# Patient Record
Sex: Male | Born: 1955 | Race: Black or African American | Hispanic: No | Smoking: Current every day smoker
Health system: Southern US, Community
[De-identification: ages and names within clinical notes are randomized; demographics above are authoritative.]

## PROBLEM LIST (undated history)

## (undated) DIAGNOSIS — J939 Pneumothorax, unspecified: Secondary | ICD-10-CM

## (undated) DIAGNOSIS — R569 Unspecified convulsions: Secondary | ICD-10-CM

## (undated) DIAGNOSIS — F419 Anxiety disorder, unspecified: Secondary | ICD-10-CM

## (undated) DIAGNOSIS — M549 Dorsalgia, unspecified: Secondary | ICD-10-CM

---

## 2011-11-15 ENCOUNTER — Emergency Department (HOSPITAL_COMMUNITY)
Admission: EM | Admit: 2011-11-15 | Discharge: 2011-11-15 | Disposition: A | Discharge status: Home / Self Care | Payer: Self-pay | Attending: Emergency Medicine | Admitting: Emergency Medicine

## 2011-11-15 ENCOUNTER — Emergency Department (HOSPITAL_COMMUNITY): Payer: Self-pay

## 2011-11-15 ENCOUNTER — Encounter (HOSPITAL_COMMUNITY): Payer: Self-pay

## 2011-11-15 DIAGNOSIS — S2239XA Fracture of one rib, unspecified side, initial encounter for closed fracture: Secondary | ICD-10-CM

## 2011-11-15 DIAGNOSIS — R079 Chest pain, unspecified: Secondary | ICD-10-CM | POA: Insufficient documentation

## 2011-11-15 DIAGNOSIS — R6884 Jaw pain: Secondary | ICD-10-CM | POA: Insufficient documentation

## 2011-11-15 DIAGNOSIS — S2249XA Multiple fractures of ribs, unspecified side, initial encounter for closed fracture: Secondary | ICD-10-CM | POA: Insufficient documentation

## 2011-11-15 DIAGNOSIS — F17200 Nicotine dependence, unspecified, uncomplicated: Secondary | ICD-10-CM | POA: Insufficient documentation

## 2011-11-15 DIAGNOSIS — K029 Dental caries, unspecified: Secondary | ICD-10-CM | POA: Insufficient documentation

## 2011-11-15 DIAGNOSIS — R220 Localized swelling, mass and lump, head: Secondary | ICD-10-CM | POA: Insufficient documentation

## 2011-11-15 DIAGNOSIS — K0889 Other specified disorders of teeth and supporting structures: Secondary | ICD-10-CM | POA: Insufficient documentation

## 2011-11-15 DIAGNOSIS — R221 Localized swelling, mass and lump, neck: Secondary | ICD-10-CM | POA: Insufficient documentation

## 2011-11-15 HISTORY — DX: Pneumothorax, unspecified: J93.9

## 2011-11-15 MED ORDER — OXYCODONE-ACETAMINOPHEN 5-325 MG PO TABS: 1.0000 | ORAL_TABLET | ORAL | Status: AC | PRN

## 2011-11-15 MED ORDER — IBUPROFEN 800 MG PO TABS
800.0000 mg | ORAL_TABLET | Freq: Once | ORAL | Status: AC
  Administered 2011-11-15: 800 mg via ORAL
  Filled 2011-11-15: qty 1

## 2011-11-15 MED ORDER — METHOCARBAMOL 500 MG PO TABS: 500.0000 mg | ORAL_TABLET | Freq: Two times a day (BID) | ORAL | Status: AC

## 2011-11-15 MED ORDER — IBUPROFEN 800 MG PO TABS: 600.0000 mg | ORAL_TABLET | Freq: Three times a day (TID) | ORAL | Status: AC

## 2011-11-15 MED ORDER — OXYCODONE-ACETAMINOPHEN 5-325 MG PO TABS
1.0000 | ORAL_TABLET | Freq: Once | ORAL | Status: AC
  Administered 2011-11-15: 1 via ORAL
  Filled 2011-11-15: qty 1

## 2011-11-15 NOTE — ED Notes (Signed)
[employer] at bedside 
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
## 2011-11-15 NOTE — ED Notes (Signed)
Pt given instructions on insentive spirometry and returned excellent demo to nurse on use. Pt able to do ~2100 w/ 7 inspirations

## 2011-11-22 NOTE — ED Provider Notes (Signed)
Medical screening examination/treatment/procedure(s) were conducted as a shared visit with non-physician practitioner(s) and myself.  I personally evaluated the patient during the encounter  Derwood Kaplan, MD 11/22/11 0023

## 2014-12-26 ENCOUNTER — Emergency Department (HOSPITAL_COMMUNITY): Payer: No Typology Code available for payment source

## 2014-12-26 ENCOUNTER — Encounter (HOSPITAL_COMMUNITY): Payer: Self-pay | Admitting: Emergency Medicine

## 2014-12-26 ENCOUNTER — Emergency Department (HOSPITAL_COMMUNITY)
Admission: EM | Admit: 2014-12-26 | Discharge: 2014-12-26 | Disposition: A | Discharge status: Home / Self Care | Payer: No Typology Code available for payment source | Attending: Emergency Medicine | Admitting: Emergency Medicine

## 2014-12-26 DIAGNOSIS — R0602 Shortness of breath: Secondary | ICD-10-CM | POA: Diagnosis not present

## 2014-12-26 DIAGNOSIS — Z8709 Personal history of other diseases of the respiratory system: Secondary | ICD-10-CM | POA: Diagnosis not present

## 2014-12-26 DIAGNOSIS — Y998 Other external cause status: Secondary | ICD-10-CM | POA: Diagnosis not present

## 2014-12-26 DIAGNOSIS — S2232XA Fracture of one rib, left side, initial encounter for closed fracture: Secondary | ICD-10-CM | POA: Diagnosis not present

## 2014-12-26 DIAGNOSIS — Z8659 Personal history of other mental and behavioral disorders: Secondary | ICD-10-CM | POA: Diagnosis not present

## 2014-12-26 DIAGNOSIS — R0789 Other chest pain: Secondary | ICD-10-CM

## 2014-12-26 DIAGNOSIS — Y9289 Other specified places as the place of occurrence of the external cause: Secondary | ICD-10-CM | POA: Insufficient documentation

## 2014-12-26 DIAGNOSIS — Z720 Tobacco use: Secondary | ICD-10-CM | POA: Insufficient documentation

## 2014-12-26 DIAGNOSIS — S299XXA Unspecified injury of thorax, initial encounter: Secondary | ICD-10-CM | POA: Diagnosis present

## 2014-12-26 DIAGNOSIS — Y9389 Activity, other specified: Secondary | ICD-10-CM | POA: Insufficient documentation

## 2014-12-26 HISTORY — DX: Anxiety disorder, unspecified: F41.9

## 2014-12-26 MED ORDER — ACETAMINOPHEN 500 MG PO TABS
1000.0000 mg | ORAL_TABLET | Freq: Once | ORAL | Status: AC
  Administered 2014-12-26: 1000 mg via ORAL
  Filled 2014-12-26: qty 2
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
## 2014-12-26 NOTE — ED Notes (Signed)
Patient transported to X-ray 
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
## 2014-12-26 NOTE — ED Notes (Signed)
Awake. Verbally responsive. A/O x4. Resp even and unlabored. No audible adventitious breath sounds noted. ABC's intact.  

## 2014-12-26 NOTE — Discharge Instructions (Signed)
Take 4 over the counter ibuprofen tablets 3 times a day or 2 over-the-counter naproxen tablets twice a day for pain. ° °Rib Fracture °A rib fracture is a break or crack in one of the bones of the ribs. The ribs are a group of long, curved bones that wrap around your chest and attach to your spine. They protect your lungs and other organs in the chest cavity. A broken or cracked rib is often painful, but most do not cause other problems. Most rib fractures heal on their own over time. However, rib fractures can be more serious if multiple ribs are broken or if broken ribs move out of place and push against other structures. °CAUSES  °· A direct blow to the chest. For example, this could happen during contact sports, a car accident, or a fall against a hard object. °· Repetitive movements with high force, such as pitching a baseball or having severe coughing spells. °SYMPTOMS  °· Pain when you breathe in or cough. °· Pain when someone presses on the injured area. °DIAGNOSIS  °Your caregiver will perform a physical exam. Various imaging tests may be ordered to confirm the diagnosis and to look for related injuries. These tests may include a chest X-ray, computed tomography (CT), magnetic resonance imaging (MRI), or a bone scan. °TREATMENT  °Rib fractures usually heal on their own in 1-3 months. The longer healing period is often associated with a continued cough or other aggravating activities. During the healing period, pain control is very important. Medication is usually given to control pain. Hospitalization or surgery may be needed for more severe injuries, such as those in which multiple ribs are broken or the ribs have moved out of place.  °HOME CARE INSTRUCTIONS  °· Avoid strenuous activity and any activities or movements that cause pain. Be careful during activities and avoid bumping the injured rib. °· Gradually increase activity as directed by your caregiver. °· Only take over-the-counter or prescription  medications as directed by your caregiver. Do not take other medications without asking your caregiver first. °· Apply ice to the injured area for the first 1-2 days after you have been treated or as directed by your caregiver. Applying ice helps to reduce inflammation and pain. °¨ Put ice in a plastic bag. °¨ Place a towel between your skin and the bag.   °¨ Leave the ice on for 15-20 minutes at a time, every 2 hours while you are awake. °· Perform deep breathing as directed by your caregiver. This will help prevent pneumonia, which is a common complication of a broken rib. Your caregiver may instruct you to: °¨ Take deep breaths several times a day. °¨ Try to cough several times a day, holding a pillow against the injured area. °¨ Use a device called an incentive spirometer to practice deep breathing several times a day. °· Drink enough fluids to keep your urine clear or pale yellow. This will help you avoid constipation.   °· Do not wear a rib belt or binder. These restrict breathing, which can lead to pneumonia.   °SEEK IMMEDIATE MEDICAL CARE IF:  °· You have a fever.   °· You have difficulty breathing or shortness of breath.   °· You develop a continual cough, or you cough up thick or bloody sputum. °· You feel sick to your stomach (nausea), throw up (vomit), or have abdominal pain.   °· You have worsening pain not controlled with medications.   °MAKE SURE YOU: °· Understand these instructions. °· Will watch your condition. °·   Will get help right away if you are not doing well or get worse. Document Released: 04/22/2005 Document Revised: 12/23/2012 Document Reviewed: 06/24/2012 [facility] Patient Information 2015 [place], [place]. This information is not intended to replace advice given to you by your health care provider. Make sure you discuss any questions you have with your health care provider.

## 2015-04-10 ENCOUNTER — Emergency Department (HOSPITAL_COMMUNITY): Payer: No Typology Code available for payment source

## 2015-04-10 ENCOUNTER — Emergency Department (HOSPITAL_COMMUNITY)
Admission: EM | Admit: 2015-04-10 | Discharge: 2015-04-10 | Disposition: A | Discharge status: Home / Self Care | Payer: No Typology Code available for payment source | Attending: Emergency Medicine | Admitting: Emergency Medicine

## 2015-04-10 ENCOUNTER — Encounter (HOSPITAL_COMMUNITY): Payer: Self-pay | Admitting: Family Medicine

## 2015-04-10 DIAGNOSIS — R1013 Epigastric pain: Secondary | ICD-10-CM | POA: Diagnosis not present

## 2015-04-10 DIAGNOSIS — R1110 Vomiting, unspecified: Secondary | ICD-10-CM | POA: Insufficient documentation

## 2015-04-10 DIAGNOSIS — F17200 Nicotine dependence, unspecified, uncomplicated: Secondary | ICD-10-CM | POA: Insufficient documentation

## 2015-04-10 DIAGNOSIS — Z8709 Personal history of other diseases of the respiratory system: Secondary | ICD-10-CM | POA: Diagnosis not present

## 2015-04-10 DIAGNOSIS — Z8659 Personal history of other mental and behavioral disorders: Secondary | ICD-10-CM | POA: Diagnosis not present

## 2015-04-10 LAB — URINALYSIS, ROUTINE W REFLEX MICROSCOPIC
GLUCOSE, UA: NEGATIVE mg/dL
HGB URINE DIPSTICK: NEGATIVE
Ketones, ur: NEGATIVE mg/dL
Leukocytes, UA: NEGATIVE
Nitrite: NEGATIVE
Protein, ur: NEGATIVE mg/dL
SPECIFIC GRAVITY, URINE: 1.017 (ref 1.005–1.030)
pH: 6 (ref 5.0–8.0)

## 2015-04-10 LAB — COMPREHENSIVE METABOLIC PANEL
ALBUMIN: 3.7 g/dL (ref 3.5–5.0)
ALT: 124 U/L — AB (ref 17–63)
ANION GAP: 10 (ref 5–15)
AST: 77 U/L — ABNORMAL HIGH (ref 15–41)
Alkaline Phosphatase: 131 U/L — ABNORMAL HIGH (ref 38–126)
BUN: 10 mg/dL (ref 6–20)
CHLORIDE: 91 mmol/L — AB (ref 101–111)
CO2: 30 mmol/L (ref 22–32)
Calcium: 9.5 mg/dL (ref 8.9–10.3)
Creatinine, Ser: 1.1 mg/dL (ref 0.61–1.24)
GFR calc non Af Amer: >60 mL/min (ref >60)
Glucose, Bld: 115 mg/dL — ABNORMAL HIGH (ref 65–99)
Potassium: 3.2 mmol/L — ABNORMAL LOW (ref 3.5–5.1)
SODIUM: 131 mmol/L — AB (ref 135–145)
Total Bilirubin: 0.7 mg/dL (ref 0.3–1.2)
Total Protein: 7.5 g/dL (ref 6.5–8.1)

## 2015-04-10 LAB — CBC
HEMATOCRIT: 38.2 % — AB (ref 39.0–52.0)
HEMOGLOBIN: 13.3 g/dL (ref 13.0–17.0)
MCH: 33.4 pg (ref 26.0–34.0)
MCHC: 34.8 g/dL (ref 30.0–36.0)
MCV: 96 fL (ref 78.0–100.0)
Platelets: 269 10*3/uL (ref 150–400)
RBC: 3.98 MIL/uL — AB (ref 4.22–5.81)
RDW: 12.6 % (ref 11.5–15.5)
WBC: 6.7 10*3/uL (ref 4.0–10.5)

## 2015-04-10 LAB — LIPASE, BLOOD: Lipase: 60 U/L — ABNORMAL HIGH (ref 11–51)

## 2015-04-10 MED ORDER — SUCRALFATE 1 GM/10ML PO SUSP: 1.0000 g | Freq: Three times a day (TID) | ORAL | Status: DC

## 2015-04-10 MED ORDER — OMEPRAZOLE 20 MG PO CPDR: 20.0000 mg | DELAYED_RELEASE_CAPSULE | Freq: Every day | ORAL | Status: DC

## 2015-04-10 MED ORDER — POTASSIUM CHLORIDE 10 MEQ/100ML IV SOLN
10.0000 meq | Freq: Once | INTRAVENOUS | Status: AC
  Administered 2015-04-10: 10 meq via INTRAVENOUS
  Filled 2015-04-10: qty 100

## 2015-04-10 MED ORDER — POTASSIUM CHLORIDE CRYS ER 20 MEQ PO TBCR
40.0000 meq | EXTENDED_RELEASE_TABLET | Freq: Once | ORAL | Status: AC
  Administered 2015-04-10: 40 meq via ORAL
  Filled 2015-04-10: qty 2

## 2015-04-10 NOTE — Discharge Instructions (Signed)

## 2015-04-10 NOTE — ED Notes (Signed)
Pt here for sickness x 1 week. sts N,V and the sensation of something stuck in esophagus. sts vomited dark emesis. sts unable to eat anything solid. sts knot in upper abdomen.

## 2015-04-10 NOTE — ED Provider Notes (Signed)
CSN: [id number]     Arrival date & time 04/10/15  1438 History   First MD Initiated Contact with Patient 04/10/15 1841     Chief Complaint  Patient presents with  . Abdominal Pain  . Emesis   HPI  Nathaniel Murphy is a 59 -year-old male with a past medical history of anxiety and recurrent pneumothoraces presenting with abdominal pain and vomiting. He reports that approximately one week ago he began experiencing severe epigastric pain every time he attempted to eat or drink. He also states that he has had multiple episodes of vomiting "molasses like stuff" when the epigastric pain first began. He has not vomited in the past few days. He states that his abdominal pain is only present after eating. He states that if he abstains from food than the pain resolves. He states that he is able to drink some soups and water without significant pain but cannot tolerate large, solid meals. He denies having the epigastric pain in the past or acid reflux symptoms. He does not take any PPIs. He states he has taken Mylanta and Pepto-Bismol in an attempt to relieve his abdominal pain without success. Denies fevers, chills, headaches, dizziness, syncope, chest pain, SOB, cough, nausea, diarrhea or rash.  Past Medical History  Diagnosis Date  . Pneumothorax   . Anxiety    History reviewed. No pertinent past surgical history. Family History  Problem Relation Age of Onset  . Heart failure Mother   . Cancer Father   . Heart failure Brother    Social History  Substance Use Topics  . Smoking status: Current Every Day Smoker -- 0.50 packs/day  . Smokeless tobacco: None  . Alcohol Use: 3.6 oz/week    6 Cans of beer per week    Review of Systems  Constitutional: Negative for fever and chills.  HENT: Negative.   Eyes: Negative for visual disturbance.  Respiratory: Negative for shortness of breath.   Cardiovascular: Negative for chest pain.  Gastrointestinal: Positive for vomiting and abdominal pain. Negative for  nausea, diarrhea and blood in stool.  Neurological: Negative for dizziness, syncope and headaches.  All other systems reviewed and are negative.     Allergies  Review of patient's allergies indicates no known allergies.  Home Medications   Prior to Admission medications   Medication Sig Start Date End Date Taking? Authorizing Provider  alum & mag hydroxide-simeth (MAALOX/MYLANTA) 200-200-20 MG/5ML suspension Take 30 mLs by mouth every 6 (six) hours as needed for indigestion or heartburn.   Yes Historical Provider, MD  bismuth subsalicylate (PEPTO BISMOL) 262 MG/15ML suspension Take 30 mLs by mouth every 6 (six) hours as needed for indigestion.   Yes Historical Provider, MD  ibuprofen (ADVIL,MOTRIN) 200 MG tablet Take 400 mg by mouth every 4 (four) hours as needed (pain).   Yes Historical Provider, MD  Mouthwashes (ANTISEPTIC MOUTH RINSE MT) Use as directed 5 mLs in the mouth or throat as needed (pain/irritation).   Yes Historical Provider, MD  omeprazole (PRILOSEC) 20 MG capsule Take 1 capsule (20 mg total) by mouth daily. 04/10/15   Nathaniel Conley, PA-C  sucralfate (CARAFATE) 1 GM/10ML suspension Take 10 mLs (1 g total) by mouth 4 (four) times daily -  with meals and at bedtime. 04/10/15   Nathaniel Manes, PA-C   BP 128/81 mmHg  Pulse 67  Temp(Src) 98.7 F (37.1 C)  Resp 18  SpO2 100% Physical Exam  Constitutional: He appears well-developed and well-nourished. No distress.  HENT:  Head: Normocephalic and  atraumatic.  Mouth/Throat: Oropharynx is clear and moist.  Eyes: Conjunctivae are normal. Right eye exhibits no discharge. Left eye exhibits no discharge. No scleral icterus.  Neck: Normal range of motion.  Cardiovascular: Normal rate, regular rhythm and normal heart sounds.   Pulmonary/Chest: Effort normal and breath sounds normal. No respiratory distress. He has no wheezes. He has no rales.  Abdominal: Soft. Normal appearance. There is tenderness in the epigastric area. There is no  rigidity, no rebound and no guarding.  Musculoskeletal: Normal range of motion. He exhibits no edema or tenderness.  Neurological: He is alert. Coordination normal.  Skin: Skin is warm and dry.  Psychiatric: He has a normal mood and affect. His behavior is normal.  Nursing note and vitals reviewed.   ED Course  Procedures (including critical care time) Labs Review Labs Reviewed  LIPASE, BLOOD - Abnormal; Notable for the following:    Lipase 60 (*)    All other components within normal limits  COMPREHENSIVE METABOLIC PANEL - Abnormal; Notable for the following:    Sodium 131 (*)    Potassium 3.2 (*)    Chloride 91 (*)    Glucose, Bld 115 (*)    AST 77 (*)    ALT 124 (*)    Alkaline Phosphatase 131 (*)    All other components within normal limits  CBC - Abnormal; Notable for the following:    RBC 3.98 (*)    HCT 38.2 (*)    All other components within normal limits  URINALYSIS, ROUTINE W REFLEX MICROSCOPIC (NOT AT [facility]) - Abnormal; Notable for the following:    Bilirubin Urine SMALL (*)    All other components within normal limits    Imaging Review US Abdomen Complete  04/10/2015  CLINICAL DATA:  Acute onset of epigastric abdominal pain. Elevated LFTs and lipase. Initial encounter. EXAM: ULTRASOUND ABDOMEN COMPLETE COMPARISON:  None. FINDINGS: Gallbladder: No gallstones or wall thickening visualized. Contracted and grossly unremarkable in appearance. No sonographic Murphy sign noted. Common bile duct: Diameter: 0.3 cm, within normal limits in caliber. Liver: No focal lesion identified. Diffusely increased parenchymal echogenicity, likely reflecting fatty infiltration. IVC: No abnormality visualized. Pancreas: Visualized portion unremarkable. Spleen: Size and appearance within normal limits. Right Kidney: Length: 9.4 cm. Echogenicity within normal limits. No mass or hydronephrosis visualized. Left Kidney: Length: 5.4 cm. Echogenicity within normal limits. No mass or hydronephrosis  visualized. Abdominal aorta: No aneurysm visualized. Other findings: None. IMPRESSION: 1. No acute abnormality seen within the abdomen. 2. Mild fatty infiltration within the liver. Electronically Signed   By: Garald Balding M.D.   On: 04/10/2015 22:12   I have personally reviewed and evaluated these images and lab results as part of my medical decision-making.   EKG Interpretation None      MDM   Final diagnoses:  Epigastric abdominal pain   59 year old male presenting with post-prandial epigastric pain x 1 week. Also noted "molasses like" vomiting a few days ago but this has resolved. VSS. Patient is nontoxic, nonseptic appearing, in no apparent distress. Labs, imaging and vitals reviewed. Potassium 3.2 and repleted in ED. LFTs and alk phos slightly elevated. Abdominal US negative for acute processes. PUD likely source of symptoms. Patient does not meet the SIRS or Sepsis criteria.  On repeat exam patient remains asymptomatic and does not have a surgical abdomen and there are no peritoneal signs. He is requesting to eat. Patient discharged home with symptomatic treatment and given strict instructions for follow-up with their primary  care physician or gastroenterology.  I have also discussed reasons to return immediately to the ER. Patient expresses understanding and agrees with plan.      Josephina Gip, PA-C 04/10/15 [place], MD 04/11/15 0000

## 2015-04-10 NOTE — ED Notes (Signed)
Pt states that he hasn't been able to eat due to pain in his throat and stomach. Pt states that "when I eat it feels like I am eating glass or nails:" Pt states that he has tried different home remedies and OTC medications "pepto bismal, mylanta" without relief.

## 2015-06-03 ENCOUNTER — Emergency Department (HOSPITAL_COMMUNITY): Payer: BLUE CROSS/BLUE SHIELD

## 2015-06-03 ENCOUNTER — Encounter (HOSPITAL_COMMUNITY): Payer: Self-pay | Admitting: Emergency Medicine

## 2015-06-03 ENCOUNTER — Emergency Department (HOSPITAL_COMMUNITY)
Admission: EM | Admit: 2015-06-03 | Discharge: 2015-06-03 | Disposition: A | Discharge status: Home / Self Care | Payer: BLUE CROSS/BLUE SHIELD | Attending: Emergency Medicine | Admitting: Emergency Medicine

## 2015-06-03 DIAGNOSIS — F1010 Alcohol abuse, uncomplicated: Secondary | ICD-10-CM

## 2015-06-03 DIAGNOSIS — R55 Syncope and collapse: Secondary | ICD-10-CM

## 2015-06-03 DIAGNOSIS — F10120 Alcohol abuse with intoxication, uncomplicated: Secondary | ICD-10-CM | POA: Diagnosis present

## 2015-06-03 DIAGNOSIS — R51 Headache: Secondary | ICD-10-CM | POA: Insufficient documentation

## 2015-06-03 DIAGNOSIS — Z8709 Personal history of other diseases of the respiratory system: Secondary | ICD-10-CM | POA: Diagnosis not present

## 2015-06-03 DIAGNOSIS — Z79899 Other long term (current) drug therapy: Secondary | ICD-10-CM | POA: Insufficient documentation

## 2015-06-03 DIAGNOSIS — Z8659 Personal history of other mental and behavioral disorders: Secondary | ICD-10-CM | POA: Insufficient documentation

## 2015-06-03 DIAGNOSIS — F17210 Nicotine dependence, cigarettes, uncomplicated: Secondary | ICD-10-CM | POA: Insufficient documentation

## 2015-06-03 LAB — CBC
HEMATOCRIT: 39.9 % (ref 39.0–52.0)
HEMOGLOBIN: 13.6 g/dL (ref 13.0–17.0)
MCH: 32.2 pg (ref 26.0–34.0)
MCHC: 34.1 g/dL (ref 30.0–36.0)
MCV: 94.3 fL (ref 78.0–100.0)
PLATELETS: 105 10*3/uL — AB (ref 150–400)
RBC: 4.23 MIL/uL (ref 4.22–5.81)
RDW: 12.8 % (ref 11.5–15.5)
WBC: 3.7 10*3/uL — AB (ref 4.0–10.5)

## 2015-06-03 LAB — URINALYSIS, ROUTINE W REFLEX MICROSCOPIC
Bilirubin Urine: NEGATIVE
GLUCOSE, UA: NEGATIVE mg/dL
Hgb urine dipstick: NEGATIVE
Ketones, ur: NEGATIVE mg/dL
LEUKOCYTES UA: NEGATIVE
Nitrite: NEGATIVE
PROTEIN: 30 mg/dL — AB
Specific Gravity, Urine: 1.01 (ref 1.005–1.030)
pH: 6.5 (ref 5.0–8.0)

## 2015-06-03 LAB — BASIC METABOLIC PANEL
ANION GAP: 13 (ref 5–15)
BUN: 6 mg/dL (ref 6–20)
CHLORIDE: 101 mmol/L (ref 101–111)
CO2: 27 mmol/L (ref 22–32)
CREATININE: 1.07 mg/dL (ref 0.61–1.24)
Calcium: 8.8 mg/dL — ABNORMAL LOW (ref 8.9–10.3)
GFR calc non Af Amer: >60 mL/min (ref >60)
Glucose, Bld: 98 mg/dL (ref 65–99)
POTASSIUM: 4 mmol/L (ref 3.5–5.1)
SODIUM: 141 mmol/L (ref 135–145)

## 2015-06-03 LAB — URINE MICROSCOPIC-ADD ON

## 2015-06-03 LAB — CBG MONITORING, ED: Glucose-Capillary: 71 mg/dL (ref 65–99)

## 2015-06-03 NOTE — ED Notes (Signed)
I entered the pt's room in order to obtain a CBG. I informed the pt of this, and he initially refused. Pt then gave permission, and asked if it would hurt. I told pt that it would hurt for "less than half of a second." Pt said to his visitors, "If she pokes me, I'll poke her back." Pt's visitor stated, "Half a second? You can't even last that long." I advised pt and visitors that such comments were inappropriate in the ED and they would not be tolerated. The pt then rolled his eyes and stated, "You're getting all huffy now." I then asked the pt if I could test his blood sugar, and he consented. I asked him if I could see his finger, and he held up his middle finger. Pt's nurse Steward Drone, charge nurse Efraim Kaufmann, and charge tech Thayer Ohm were notified, and I will not be rounding on the pt without another staff member to accompany me.

## 2015-06-03 NOTE — Discharge Instructions (Signed)
Please read and follow all provided instructions.  Your diagnoses today include:  1. Syncope, unspecified syncope type   2. Alcohol abuse     Tests performed today include:  Blood counts and electrolytes - low counts due to alcohol use  CT head - no bleeding in brain or other concerning findings  Vital signs. See below for your results today.   Medications prescribed:   None  Take any prescribed medications only as directed.  Home care instructions:  Follow any educational materials contained in this packet.  Avoid tylenol due to your alcohol use.   Please work on decreasing your alcohol use.   Please hydrate well with water or juice over the next several days.   Follow-up instructions: Please follow-up with your primary care provider in the next 3 days for further evaluation of your symptoms.   Return instructions:   Please return to the Emergency Department if you experience worsening symptoms.   Please return if you have any other emergent concerns.  Additional Information:  Your vital signs today were: BP 133/86 mmHg   Pulse 73   Temp(Src) 98 F (36.7 C) (Oral)   Resp 20   Ht  (1.803 m)   Wt 70.308 kg   BMI 21.63 kg/m2   SpO2 96% If your blood pressure (BP) was elevated above 135/85 this visit, please have this repeated by your doctor within one month. --------------

## 2015-06-03 NOTE — ED Provider Notes (Signed)
CSN: [id number]     Arrival date & time 06/03/15  1208 History   First MD Initiated Contact with Patient 06/03/15 1226     Chief Complaint  Patient presents with  . Near Syncope  . Alcohol Intoxication     (Consider location/radiation/quality/duration/timing/severity/associated sxs/prior Treatment) HPI Comments: Patient with history of alcoholism, daily alcohol use presents with complaint of 2 syncopal episodes today. First episode occurred while patient was standing putting his lunch in a microwave. Patient states that he fell to the floor without any warning. He was then helped into a chair. When he got up he passed out again. He admits to drinking vodka last night. He denies chest pain or shortness of breath. EMS was called who noted patient was diaphoretic. Patient states he has never passed out before. He does admit to not eating or drinking well. He has not been sleeping well. Patient states that he has had a persistent frontal headache for the past 4 days. This is unusual for him. He denies any head injuries prior to the headache. Patient denies signs of stroke including: facial droop, slurred speech, aphasia, weakness/numbness in extremities, imbalance/trouble walking. Onset of symptoms acute. Course is resolved. Nothing makes symptoms better or worse.  Patient is a 60 y.o. male presenting with near-syncope and intoxication. The history is provided by the patient.  Near Syncope Associated symptoms include headaches. Pertinent negatives include no abdominal pain, chest pain, coughing, fever, myalgias, nausea, rash, sore throat, vomiting or weakness.  Alcohol Intoxication Associated symptoms include headaches. Pertinent negatives include no abdominal pain, chest pain, coughing, fever, myalgias, nausea, rash, sore throat, vomiting or weakness.    Past Medical History  Diagnosis Date  . Pneumothorax   . Anxiety    History reviewed. No pertinent past surgical history. Family History   Problem Relation Age of Onset  . Heart failure Mother   . Cancer Father   . Heart failure Brother    Social History  Substance Use Topics  . Smoking status: Current Every Day Smoker -- 0.50 packs/day    Types: Cigarettes  . Smokeless tobacco: Never Used  . Alcohol Use: 3.6 oz/week    6 Cans of beer per week     Comment: a fifth a day    Review of Systems  Constitutional: Negative for fever.  HENT: Negative for rhinorrhea and sore throat.   Eyes: Negative for redness.  Respiratory: Negative for cough.   Cardiovascular: Positive for near-syncope. Negative for chest pain.  Gastrointestinal: Negative for nausea, vomiting, abdominal pain and diarrhea.  Genitourinary: Negative for dysuria.  Musculoskeletal: Negative for myalgias.  Skin: Negative for rash.  Neurological: Positive for syncope and headaches. Negative for tremors, seizures, facial asymmetry, speech difficulty and weakness.    Allergies  Influenza vaccines  Home Medications   Prior to Admission medications   Medication Sig Start Date End Date Taking? Authorizing Provider  alum & mag hydroxide-simeth (MAALOX/MYLANTA) 200-200-20 MG/5ML suspension Take 30 mLs by mouth every 6 (six) hours as needed for indigestion or heartburn.   Yes Historical Provider, MD  bismuth subsalicylate (PEPTO BISMOL) 262 MG/15ML suspension Take 30 mLs by mouth every 6 (six) hours as needed for indigestion.   Yes Historical Provider, MD  ibuprofen (ADVIL,MOTRIN) 200 MG tablet Take 400 mg by mouth every 4 (four) hours as needed (pain).   Yes Historical Provider, MD  Mouthwashes (ANTISEPTIC MOUTH RINSE MT) Use as directed 5 mLs in the mouth or throat as needed (pain/irritation).   Yes Historical Provider,  MD  Multiple Vitamins-Minerals (CENTRUM ADULTS PO) Take 1 tablet by mouth daily.   Yes Historical Provider, MD  omeprazole (PRILOSEC) 20 MG capsule Take 1 capsule (20 mg total) by mouth daily. Patient not taking: Reported on 06/03/2015 04/10/15    Rolm Gala Barrett, PA-C  sucralfate (CARAFATE) 1 GM/10ML suspension Take 10 mLs (1 g total) by mouth 4 (four) times daily -  with meals and at bedtime. Patient not taking: Reported on 06/03/2015 04/10/15   Stevi Barrett, PA-C   BP 111/86 mmHg  Pulse 82  Temp(Src) 98 F (36.7 C) (Oral)  Resp 17  Ht  (1.803 m)  Wt 70.308 kg  BMI 21.63 kg/m2  SpO2 96%   Physical Exam  Constitutional: He is oriented to person, place, and time. He appears well-developed and well-nourished.  HENT:  Head: Normocephalic and atraumatic.  Right Ear: Tympanic membrane, external ear and ear canal normal.  Left Ear: Tympanic membrane, external ear and ear canal normal.  Nose: Nose normal.  Mouth/Throat: Uvula is midline, oropharynx is clear and moist and mucous membranes are normal.  Eyes: Conjunctivae, EOM and lids are normal. Pupils are equal, round, and reactive to light. Right eye exhibits no discharge. Left eye exhibits no discharge.  Neck: Normal range of motion. Neck supple.  Cardiovascular: Normal rate, regular rhythm and normal heart sounds.   Pulmonary/Chest: Effort normal and breath sounds normal. No respiratory distress. He has no wheezes. He has no rales.  Abdominal: Soft. There is no tenderness. There is no rebound and no guarding.  Musculoskeletal: Normal range of motion.       Cervical back: He exhibits normal range of motion, no tenderness and no bony tenderness.  Neurological: He is alert and oriented to person, place, and time. He has normal strength and normal reflexes. No cranial nerve deficit or sensory deficit. He exhibits normal muscle tone. He displays a negative Romberg sign. Coordination and gait normal. GCS eye subscore is 4. GCS verbal subscore is 5. GCS motor subscore is 6.  Skin: Skin is warm and dry.  Psychiatric: He has a normal mood and affect.  Nursing note and vitals reviewed.   ED Course  Procedures (including critical care time) Labs Review Labs Reviewed  BASIC  METABOLIC PANEL - Abnormal; Notable for the following:    Calcium 8.8 (*)    All other components within normal limits  CBC - Abnormal; Notable for the following:    WBC 3.7 (*)    Platelets 105 (*)    All other components within normal limits  URINALYSIS, ROUTINE W REFLEX MICROSCOPIC (NOT AT [facility]) - Abnormal; Notable for the following:    Protein, ur 30 (*)    All other components within normal limits  URINE MICROSCOPIC-ADD ON - Abnormal; Notable for the following:    Squamous Epithelial / LPF 0-5 (*)    Bacteria, UA RARE (*)    All other components within normal limits  CBG MONITORING, ED    Imaging Review Ct Head Wo Contrast  06/03/2015  CLINICAL DATA:  60 year old male with headache for 4 days. EXAM: CT HEAD WITHOUT CONTRAST TECHNIQUE: Contiguous axial images were obtained from the base of the skull through the vertex without intravenous contrast. COMPARISON:  11/15/2011 facial CT. FINDINGS: Focal encephalomalacia within the posterior left temporal encephalomalacia is new since 2013. Mild chronic small-vessel white matter ischemic changes are present. No acute intracranial abnormalities are identified, including mass lesion or mass effect, hydrocephalus, extra-axial fluid collection, midline shift, hemorrhage, or acute  infarction. The visualized bony calvarium is unremarkable. IMPRESSION: No evidence of acute intracranial abnormality. Posterior left temporal encephalomalacia. Mild chronic small-vessel white matter ischemic changes. Electronically Signed   By: Harmon Pier M.D.   On: 06/03/2015 14:30   I have personally reviewed and evaluated these images and lab results as part of my medical decision-making.   EKG Interpretation   Date/Time:  Saturday June 03 2015 12:17:50 EST Ventricular Rate:  76 PR Interval:  143 QRS Duration: 101 QT Interval:  428 QTC Calculation: 481 R Axis:   73 Text Interpretation:  Sinus rhythm Left ventricular hypertrophy Borderline  prolonged QT  interval ok. no change from old Confirmed by Donnald Garre, MD,  Lebron Conners [phone]) on 06/03/2015 12:31:37 PM       1:19 PM Patient seen and examined. Patient getting fluids. Will get head CT given EtOH use and new HA. Will check orthostatics and labs. EKG reviewed.   Vital signs reviewed and are as follows: BP 111/86 mmHg  Pulse 82  Temp(Src) 98 F (36.7 C) (Oral)  Resp 17  Ht  (1.803 m)  Wt 70.308 kg  BMI 21.63 kg/m2  SpO2 96%  3:49 PM Patient history and results discussed with Dr. Donnald Garre.   Patient updated on results. He is slightly agitated and wants to leave. No objective symptoms of EtOH withdrawal. He states he is hungry and wants to go home. He continues to have a headache. He is upset that we cannot find the cause of his headache today.  I have encouraged him to hydrate well, follow-up with PCP. Referrals given.   Encouraged return to the emergency department with continued episodes of passing out.  MDM   Final diagnoses:  Syncope, unspecified syncope type  Alcohol abuse   Syncope: EKG with borderline but not pronounced prolonged QT, otherwise normal. Electrolytes normal. No indication today of seizure-like activity. Syncope likely related in part to dehydration from excessive alcohol use. No A. fib. Patient is mildly orthostatic here. Patient otherwise denies chest pain. Low suspicion for arrhythmogenic syncope.   HA: Head CT is negative for bleeding or other problems. No fever or other clinical signs to suggest meningitis. Normal neurological exam and mental status.  Alcohol abuse: Mild leukopenia and thrombocytopenia likely related to alcohol use.  No dangerous or life-threatening conditions suspected or identified by history, physical exam, and by work-up. No indications for hospitalization identified.      Renne Crigler, PA-C 06/03/15 1606  Arby Barrette, MD 06/11/15 [phone]

## 2015-06-03 NOTE — ED Notes (Signed)
Loraine Leriche Roommate - [phone]

## 2015-06-03 NOTE — ED Notes (Signed)
Pt from home via [facility] with c/o 2 near syncopal/increased weakness episodes while walking down the hall.  On EMS arrival pt was diaphoretic and had faint/thready pulses.  EKG unremarkable, NSR.  Pt reports drinking a fifth of vodka between last night and this morning.  NAD, A&O.

## 2016-11-21 ENCOUNTER — Emergency Department (HOSPITAL_COMMUNITY)
Admission: EM | Admit: 2016-11-21 | Discharge: 2016-11-21 | Disposition: A | Discharge status: Home / Self Care | Payer: BLUE CROSS/BLUE SHIELD | Attending: Emergency Medicine | Admitting: Emergency Medicine

## 2016-11-21 ENCOUNTER — Encounter (HOSPITAL_COMMUNITY): Payer: Self-pay | Admitting: Nurse Practitioner

## 2016-11-21 DIAGNOSIS — T2020XA Burn of second degree of head, face, and neck, unspecified site, initial encounter: Secondary | ICD-10-CM

## 2016-11-21 DIAGNOSIS — Y929 Unspecified place or not applicable: Secondary | ICD-10-CM | POA: Insufficient documentation

## 2016-11-21 DIAGNOSIS — T2020XD Burn of second degree of head, face, and neck, unspecified site, subsequent encounter: Secondary | ICD-10-CM | POA: Insufficient documentation

## 2016-11-21 DIAGNOSIS — Y9389 Activity, other specified: Secondary | ICD-10-CM | POA: Insufficient documentation

## 2016-11-21 DIAGNOSIS — Y999 Unspecified external cause status: Secondary | ICD-10-CM | POA: Insufficient documentation

## 2016-11-21 DIAGNOSIS — Z79899 Other long term (current) drug therapy: Secondary | ICD-10-CM | POA: Insufficient documentation

## 2016-11-21 DIAGNOSIS — Z791 Long term (current) use of non-steroidal anti-inflammatories (NSAID): Secondary | ICD-10-CM | POA: Insufficient documentation

## 2016-11-21 DIAGNOSIS — X008XXA Other exposure to uncontrolled fire in building or structure, initial encounter: Secondary | ICD-10-CM | POA: Insufficient documentation

## 2016-11-21 DIAGNOSIS — F17210 Nicotine dependence, cigarettes, uncomplicated: Secondary | ICD-10-CM | POA: Insufficient documentation

## 2016-11-21 HISTORY — DX: Dorsalgia, unspecified: M54.9

## 2016-11-21 MED ORDER — TRAMADOL HCL 50 MG PO TABS: 50.0000 mg | ORAL_TABLET | Freq: Four times a day (QID) | ORAL | 0 refills | Status: DC | PRN
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
## 2016-11-21 NOTE — ED Notes (Signed)
Pt verbalized understanding discharge instructions and denies any further needs or questions at this time. VS stable, ambulatory and steady gait.   

## 2016-11-21 NOTE — Discharge Instructions (Signed)
Please read attached information. If you experience any new or worsening signs or symptoms please return to the emergency room for evaluation. Please follow-up with your primary care provider or specialist as discussed. Please use medication prescribed only as directed and discontinue taking if you have any concerning signs or symptoms.  Please do not drink or drive or operate any machinery while taking medication.
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
## 2017-02-23 ENCOUNTER — Encounter (HOSPITAL_COMMUNITY): Payer: Self-pay | Admitting: Emergency Medicine

## 2017-02-23 ENCOUNTER — Emergency Department (HOSPITAL_COMMUNITY): Payer: Medicaid Other

## 2017-02-23 ENCOUNTER — Inpatient Hospital Stay (HOSPITAL_COMMUNITY)
Admission: EM | Admit: 2017-02-23 | Discharge: 2017-03-26 | DRG: 025 | Disposition: A | Discharge status: Skilled Nursing Facility | Payer: Medicaid Other | Attending: General Surgery | Admitting: General Surgery

## 2017-02-23 ENCOUNTER — Encounter (HOSPITAL_COMMUNITY): Admission: EM | Disposition: A | Discharge status: Skilled Nursing Facility | Payer: Self-pay | Source: Home / Self Care

## 2017-02-23 ENCOUNTER — Emergency Department (HOSPITAL_COMMUNITY): Payer: Medicaid Other | Admitting: Anesthesiology

## 2017-02-23 DIAGNOSIS — E876 Hypokalemia: Secondary | ICD-10-CM

## 2017-02-23 DIAGNOSIS — D649 Anemia, unspecified: Secondary | ICD-10-CM

## 2017-02-23 DIAGNOSIS — R402364 Coma scale, best motor response, obeys commands, 24 hours or more after hospital admission: Secondary | ICD-10-CM | POA: Diagnosis present

## 2017-02-23 DIAGNOSIS — S065XAA Traumatic subdural hemorrhage with loss of consciousness status unknown, initial encounter: Secondary | ICD-10-CM

## 2017-02-23 DIAGNOSIS — J9601 Acute respiratory failure with hypoxia: Secondary | ICD-10-CM

## 2017-02-23 DIAGNOSIS — W108XXA Fall (on) (from) other stairs and steps, initial encounter: Secondary | ICD-10-CM

## 2017-02-23 DIAGNOSIS — S020XXA Fracture of vault of skull, initial encounter for closed fracture: Secondary | ICD-10-CM | POA: Diagnosis present

## 2017-02-23 DIAGNOSIS — R739 Hyperglycemia, unspecified: Secondary | ICD-10-CM | POA: Diagnosis present

## 2017-02-23 DIAGNOSIS — S065X9A Traumatic subdural hemorrhage with loss of consciousness of unspecified duration, initial encounter: Principal | ICD-10-CM

## 2017-02-23 DIAGNOSIS — J9690 Respiratory failure, unspecified, unspecified whether with hypoxia or hypercapnia: Secondary | ICD-10-CM

## 2017-02-23 DIAGNOSIS — T148XXA Other injury of unspecified body region, initial encounter: Secondary | ICD-10-CM

## 2017-02-23 DIAGNOSIS — G9340 Encephalopathy, unspecified: Secondary | ICD-10-CM

## 2017-02-23 DIAGNOSIS — R000 Tachycardia, unspecified: Secondary | ICD-10-CM | POA: Diagnosis present

## 2017-02-23 DIAGNOSIS — Z8249 Family history of ischemic heart disease and other diseases of the circulatory system: Secondary | ICD-10-CM

## 2017-02-23 DIAGNOSIS — Z23 Encounter for immunization: Secondary | ICD-10-CM | POA: Diagnosis not present

## 2017-02-23 DIAGNOSIS — I10 Essential (primary) hypertension: Secondary | ICD-10-CM

## 2017-02-23 DIAGNOSIS — R402244 Coma scale, best verbal response, confused conversation, 24 hours or more after hospital admission: Secondary | ICD-10-CM | POA: Diagnosis present

## 2017-02-23 DIAGNOSIS — D6489 Other specified anemias: Secondary | ICD-10-CM | POA: Diagnosis present

## 2017-02-23 DIAGNOSIS — S52552A Other extraarticular fracture of lower end of left radius, initial encounter for closed fracture: Secondary | ICD-10-CM | POA: Diagnosis present

## 2017-02-23 DIAGNOSIS — Z9289 Personal history of other medical treatment: Secondary | ICD-10-CM

## 2017-02-23 DIAGNOSIS — E861 Hypovolemia: Secondary | ICD-10-CM | POA: Diagnosis present

## 2017-02-23 DIAGNOSIS — S12100A Unspecified displaced fracture of second cervical vertebra, initial encounter for closed fracture: Secondary | ICD-10-CM | POA: Diagnosis present

## 2017-02-23 DIAGNOSIS — Z0189 Encounter for other specified special examinations: Secondary | ICD-10-CM

## 2017-02-23 DIAGNOSIS — R579 Shock, unspecified: Secondary | ICD-10-CM

## 2017-02-23 DIAGNOSIS — G894 Chronic pain syndrome: Secondary | ICD-10-CM

## 2017-02-23 DIAGNOSIS — M25532 Pain in left wrist: Secondary | ICD-10-CM

## 2017-02-23 DIAGNOSIS — S12401A Unspecified nondisplaced fracture of fifth cervical vertebra, initial encounter for closed fracture: Secondary | ICD-10-CM | POA: Diagnosis present

## 2017-02-23 DIAGNOSIS — Y92009 Unspecified place in unspecified non-institutional (private) residence as the place of occurrence of the external cause: Secondary | ICD-10-CM

## 2017-02-23 DIAGNOSIS — R402432 Glasgow coma scale score 3-8, at arrival to emergency department: Secondary | ICD-10-CM | POA: Diagnosis present

## 2017-02-23 DIAGNOSIS — D72829 Elevated white blood cell count, unspecified: Secondary | ICD-10-CM

## 2017-02-23 DIAGNOSIS — D62 Acute posthemorrhagic anemia: Secondary | ICD-10-CM | POA: Diagnosis not present

## 2017-02-23 DIAGNOSIS — S064X9A Epidural hemorrhage with loss of consciousness of unspecified duration, initial encounter: Secondary | ICD-10-CM | POA: Diagnosis present

## 2017-02-23 DIAGNOSIS — L8990 Pressure ulcer of unspecified site, unspecified stage: Secondary | ICD-10-CM

## 2017-02-23 DIAGNOSIS — S12190A Other displaced fracture of second cervical vertebra, initial encounter for closed fracture: Secondary | ICD-10-CM

## 2017-02-23 DIAGNOSIS — R0682 Tachypnea, not elsewhere classified: Secondary | ICD-10-CM

## 2017-02-23 DIAGNOSIS — F411 Generalized anxiety disorder: Secondary | ICD-10-CM

## 2017-02-23 DIAGNOSIS — M25562 Pain in left knee: Secondary | ICD-10-CM | POA: Diagnosis present

## 2017-02-23 DIAGNOSIS — J439 Emphysema, unspecified: Secondary | ICD-10-CM | POA: Diagnosis present

## 2017-02-23 DIAGNOSIS — W109XXA Fall (on) (from) unspecified stairs and steps, initial encounter: Secondary | ICD-10-CM | POA: Diagnosis present

## 2017-02-23 DIAGNOSIS — S0240CA Maxillary fracture, right side, initial encounter for closed fracture: Secondary | ICD-10-CM | POA: Diagnosis present

## 2017-02-23 DIAGNOSIS — R402144 Coma scale, eyes open, spontaneous, 24 hours or more after hospital admission: Secondary | ICD-10-CM | POA: Diagnosis present

## 2017-02-23 DIAGNOSIS — F10239 Alcohol dependence with withdrawal, unspecified: Secondary | ICD-10-CM | POA: Diagnosis present

## 2017-02-23 DIAGNOSIS — S0240EA Zygomatic fracture, right side, initial encounter for closed fracture: Secondary | ICD-10-CM | POA: Diagnosis present

## 2017-02-23 DIAGNOSIS — F17210 Nicotine dependence, cigarettes, uncomplicated: Secondary | ICD-10-CM | POA: Diagnosis present

## 2017-02-23 DIAGNOSIS — I959 Hypotension, unspecified: Secondary | ICD-10-CM | POA: Diagnosis present

## 2017-02-23 DIAGNOSIS — L89899 Pressure ulcer of other site, unspecified stage: Secondary | ICD-10-CM | POA: Diagnosis present

## 2017-02-23 DIAGNOSIS — Z9889 Other specified postprocedural states: Secondary | ICD-10-CM

## 2017-02-23 DIAGNOSIS — R569 Unspecified convulsions: Secondary | ICD-10-CM

## 2017-02-23 DIAGNOSIS — K0889 Other specified disorders of teeth and supporting structures: Secondary | ICD-10-CM | POA: Diagnosis present

## 2017-02-23 HISTORY — PX: CRANIOTOMY: SHX93

## 2017-02-23 HISTORY — DX: Unspecified convulsions: R56.9

## 2017-02-23 LAB — COMPREHENSIVE METABOLIC PANEL
ALBUMIN: 4.1 g/dL (ref 3.5–5.0)
ALK PHOS: 87 U/L (ref 38–126)
ALT: 35 U/L (ref 17–63)
AST: 41 U/L (ref 15–41)
Anion gap: 14 (ref 5–15)
BUN: 11 mg/dL (ref 6–20)
CHLORIDE: 102 mmol/L (ref 101–111)
CO2: 21 mmol/L — AB (ref 22–32)
CREATININE: 1.11 mg/dL (ref 0.61–1.24)
Calcium: 9.7 mg/dL (ref 8.9–10.3)
GFR calc Af Amer: >60 mL/min (ref >60)
GFR calc non Af Amer: >60 mL/min (ref >60)
GLUCOSE: 122 mg/dL — AB (ref 65–99)
Potassium: 4 mmol/L (ref 3.5–5.1)
SODIUM: 137 mmol/L (ref 135–145)
Total Bilirubin: 1.4 mg/dL — ABNORMAL HIGH (ref 0.3–1.2)
Total Protein: 8 g/dL (ref 6.5–8.1)

## 2017-02-23 LAB — DIFFERENTIAL
BASOS ABS: 0 10*3/uL (ref 0.0–0.1)
BASOS PCT: 0 %
Eosinophils Absolute: 0 10*3/uL (ref 0.0–0.7)
Eosinophils Relative: 0 %
LYMPHS PCT: 5 %
Lymphs Abs: 0.6 10*3/uL — ABNORMAL LOW (ref 0.7–4.0)
Monocytes Absolute: 1.3 10*3/uL — ABNORMAL HIGH (ref 0.1–1.0)
Monocytes Relative: 11 %
NEUTROS ABS: 9.8 10*3/uL — AB (ref 1.7–7.7)
Neutrophils Relative %: 84 %

## 2017-02-23 LAB — I-STAT CHEM 8, ED
BUN: 12 mg/dL (ref 6–20)
CALCIUM ION: 1.15 mmol/L (ref 1.15–1.40)
CREATININE: 0.9 mg/dL (ref 0.61–1.24)
Chloride: 106 mmol/L (ref 101–111)
Glucose, Bld: 123 mg/dL — ABNORMAL HIGH (ref 65–99)
HCT: 39 % (ref 39.0–52.0)
HEMOGLOBIN: 13.3 g/dL (ref 13.0–17.0)
Potassium: 4.1 mmol/L (ref 3.5–5.1)
Sodium: 140 mmol/L (ref 135–145)
TCO2: 26 mmol/L (ref 22–32)

## 2017-02-23 LAB — I-STAT ARTERIAL BLOOD GAS, ED
ACID-BASE EXCESS: 4 mmol/L — AB (ref 0.0–2.0)
Bicarbonate: 27.7 mmol/L (ref 20.0–28.0)
O2 Saturation: 74 %
PCO2 ART: 38.8 mmHg (ref 32.0–48.0)
PO2 ART: 36 mmHg — AB (ref 83.0–108.0)
Patient temperature: 98
TCO2: 29 mmol/L (ref 22–32)
pH, Arterial: 7.46 — ABNORMAL HIGH (ref 7.350–7.450)

## 2017-02-23 LAB — CREATININE, SERUM
CREATININE: 1.23 mg/dL (ref 0.61–1.24)
GFR calc Af Amer: >60 mL/min (ref >60)
GFR calc non Af Amer: >60 mL/min (ref >60)

## 2017-02-23 LAB — CBC
HCT: 39.3 % (ref 39.0–52.0)
HCT: 37.2 % — ABNORMAL LOW (ref 39.0–52.0)
HEMOGLOBIN: 12.4 g/dL — AB (ref 13.0–17.0)
Hemoglobin: 13.5 g/dL (ref 13.0–17.0)
MCH: 32.6 pg (ref 26.0–34.0)
MCH: 32.3 pg (ref 26.0–34.0)
MCHC: 34.4 g/dL (ref 30.0–36.0)
MCHC: 33.3 g/dL (ref 30.0–36.0)
MCV: 94.9 fL (ref 78.0–100.0)
MCV: 96.9 fL (ref 78.0–100.0)
PLATELETS: 184 10*3/uL (ref 150–400)
Platelets: 140 10*3/uL — ABNORMAL LOW (ref 150–400)
RBC: 4.14 MIL/uL — AB (ref 4.22–5.81)
RBC: 3.84 MIL/uL — ABNORMAL LOW (ref 4.22–5.81)
RDW: 14.4 % (ref 11.5–15.5)
RDW: 14.9 % (ref 11.5–15.5)
WBC: 11.7 10*3/uL — AB (ref 4.0–10.5)
WBC: 8.8 10*3/uL (ref 4.0–10.5)

## 2017-02-23 LAB — ETHANOL: Alcohol, Ethyl (B): <5 mg/dL (ref <10)

## 2017-02-23 LAB — GLUCOSE, CAPILLARY
GLUCOSE-CAPILLARY: 134 mg/dL — AB (ref 65–99)
Glucose-Capillary: 132 mg/dL — ABNORMAL HIGH (ref 65–99)
Glucose-Capillary: 182 mg/dL — ABNORMAL HIGH (ref 65–99)

## 2017-02-23 LAB — I-STAT TROPONIN, ED: Troponin i, poc: 0 ng/mL (ref 0.00–0.08)

## 2017-02-23 LAB — PHOSPHORUS
PHOSPHORUS: 2.7 mg/dL (ref 2.5–4.6)
Phosphorus: 3.1 mg/dL (ref 2.5–4.6)

## 2017-02-23 LAB — ABO/RH: ABO/RH(D): O POS

## 2017-02-23 LAB — PROTIME-INR
INR: 1.17
Prothrombin Time: 14.8 seconds (ref 11.4–15.2)

## 2017-02-23 LAB — MAGNESIUM
Magnesium: 1.7 mg/dL (ref 1.7–2.4)
Magnesium: 1.7 mg/dL (ref 1.7–2.4)

## 2017-02-23 LAB — TRIGLYCERIDES: Triglycerides: 121 mg/dL (ref <150)

## 2017-02-23 LAB — APTT: APTT: 26 s (ref 24–36)

## 2017-02-23 LAB — CK: Total CK: 716 U/L — ABNORMAL HIGH (ref 49–397)

## 2017-02-23 LAB — PREPARE RBC (CROSSMATCH)

## 2017-02-23 SURGERY — CRANIOTOMY HEMATOMA EVACUATION SUBDURAL
Anesthesia: General | Site: Head | Laterality: Left

## 2017-02-23 MED ORDER — ACETAMINOPHEN 325 MG PO TABS: 650.0000 mg | ORAL_TABLET | ORAL | Status: DC | PRN

## 2017-02-23 MED ORDER — SUCCINYLCHOLINE CHLORIDE 20 MG/ML IJ SOLN
INTRAMUSCULAR | Status: AC | PRN
  Administered 2017-02-23: 150 mg via INTRAVENOUS

## 2017-02-23 MED ORDER — POTASSIUM CHLORIDE IN NACL 20-0.9 MEQ/L-% IV SOLN
INTRAVENOUS | Status: DC
  Administered 2017-02-23 (×2): via INTRAVENOUS
  Filled 2017-02-23 (×3): qty 1000

## 2017-02-23 MED ORDER — SODIUM CHLORIDE 0.9 % IV SOLN
INTRAVENOUS | Status: DC | PRN
  Administered 2017-02-23 (×2): via INTRAVENOUS

## 2017-02-23 MED ORDER — PROPOFOL 1000 MG/100ML IV EMUL
INTRAVENOUS | Status: AC
  Administered 2017-02-23: 10 ug/kg/min via INTRAVENOUS
  Filled 2017-02-23: qty 100

## 2017-02-23 MED ORDER — PROPOFOL 10 MG/ML IV BOLUS
INTRAVENOUS | Status: DC | PRN
  Administered 2017-02-23: 40 mg via INTRAVENOUS

## 2017-02-23 MED ORDER — PROPOFOL 1000 MG/100ML IV EMUL
5.0000 ug/kg/min | Freq: Once | INTRAVENOUS | Status: AC
  Administered 2017-02-23: 10 ug/kg/min via INTRAVENOUS

## 2017-02-23 MED ORDER — FENTANYL CITRATE (PF) 100 MCG/2ML IJ SOLN
100.0000 ug | INTRAMUSCULAR | Status: DC | PRN
  Administered 2017-02-25 – 2017-02-27 (×9): 100 ug via INTRAVENOUS
  Administered 2017-03-01: 50 ug via INTRAVENOUS
  Filled 2017-02-23 (×10): qty 2

## 2017-02-23 MED ORDER — LABETALOL HCL 5 MG/ML IV SOLN
10.0000 mg | INTRAVENOUS | Status: DC | PRN
  Administered 2017-02-26: 10 mg via INTRAVENOUS
  Administered 2017-02-26: 40 mg via INTRAVENOUS
  Administered 2017-02-26: 20 mg via INTRAVENOUS
  Administered 2017-02-26 – 2017-02-27 (×2): 40 mg via INTRAVENOUS
  Administered 2017-02-27: 20 mg via INTRAVENOUS
  Administered 2017-02-28 – 2017-03-01 (×4): 40 mg via INTRAVENOUS
  Administered 2017-03-01 (×2): 20 mg via INTRAVENOUS
  Administered 2017-03-02 (×2): 40 mg via INTRAVENOUS
  Administered 2017-03-03 – 2017-03-05 (×3): 20 mg via INTRAVENOUS
  Filled 2017-02-23 (×2): qty 8
  Filled 2017-02-23: qty 4
  Filled 2017-02-23: qty 8
  Filled 2017-02-23: qty 4
  Filled 2017-02-23 (×5): qty 8
  Filled 2017-02-23 (×2): qty 4
  Filled 2017-02-23: qty 8
  Filled 2017-02-23 (×2): qty 4
  Filled 2017-02-23: qty 8
  Filled 2017-02-23: qty 4

## 2017-02-23 MED ORDER — PROPOFOL 1000 MG/100ML IV EMUL: 0.0000 ug/kg/min | INTRAVENOUS | Status: DC

## 2017-02-23 MED ORDER — PHENYLEPHRINE HCL 10 MG/ML IJ SOLN
INTRAVENOUS | Status: DC | PRN
  Administered 2017-02-23: 25 ug/min via INTRAVENOUS

## 2017-02-23 MED ORDER — MORPHINE SULFATE (PF) 4 MG/ML IV SOLN
1.0000 mg | INTRAVENOUS | Status: DC | PRN
  Administered 2017-02-23 – 2017-02-24 (×3): 2 mg via INTRAVENOUS
  Filled 2017-02-23 (×3): qty 1

## 2017-02-23 MED ORDER — LABETALOL HCL 5 MG/ML IV SOLN
INTRAVENOUS | Status: DC | PRN
  Administered 2017-02-23: 5 mg via INTRAVENOUS

## 2017-02-23 MED ORDER — DEXMEDETOMIDINE HCL IN NACL 200 MCG/50ML IV SOLN
0.4000 ug/kg/h | INTRAVENOUS | Status: DC
  Administered 2017-02-23: 1.1 ug/kg/h via INTRAVENOUS
  Administered 2017-02-23: 0.2 ug/kg/h via INTRAVENOUS
  Administered 2017-02-23: 0.7 ug/kg/h via INTRAVENOUS
  Administered 2017-02-24: 0.6 ug/kg/h via INTRAVENOUS
  Administered 2017-02-24: 0.7 ug/kg/h via INTRAVENOUS
  Administered 2017-02-24 (×2): 0.5 ug/kg/h via INTRAVENOUS
  Administered 2017-02-24: 0.6 ug/kg/h via INTRAVENOUS
  Administered 2017-02-25 (×4): 0.7 ug/kg/h via INTRAVENOUS
  Administered 2017-02-26 (×2): 0.8 ug/kg/h via INTRAVENOUS
  Administered 2017-02-26: 0.7 ug/kg/h via INTRAVENOUS
  Filled 2017-02-23 (×17): qty 50

## 2017-02-23 MED ORDER — PROPOFOL 10 MG/ML IV BOLUS
INTRAVENOUS | Status: AC
  Filled 2017-02-23: qty 40

## 2017-02-23 MED ORDER — NALOXONE HCL 0.4 MG/ML IJ SOLN
0.0800 mg | INTRAMUSCULAR | Status: DC | PRN
Start: 2017-02-23 — End: 2017-03-26

## 2017-02-23 MED ORDER — PROMETHAZINE HCL 25 MG PO TABS: 12.5000 mg | ORAL_TABLET | ORAL | Status: DC | PRN

## 2017-02-23 MED ORDER — FENTANYL CITRATE (PF) 250 MCG/5ML IJ SOLN
INTRAMUSCULAR | Status: AC
  Filled 2017-02-23: qty 5

## 2017-02-23 MED ORDER — LORAZEPAM 2 MG/ML IJ SOLN
1.0000 mg | INTRAMUSCULAR | Status: DC | PRN
  Administered 2017-02-26 – 2017-02-27 (×4): 2 mg via INTRAVENOUS
  Administered 2017-03-01 – 2017-03-03 (×4): 1 mg via INTRAVENOUS
  Administered 2017-03-03 (×2): 2 mg via INTRAVENOUS
  Administered 2017-03-03: 1 mg via INTRAVENOUS
  Administered 2017-03-04 – 2017-03-15 (×18): 2 mg via INTRAVENOUS
  Filled 2017-02-23 (×32): qty 1

## 2017-02-23 MED ORDER — BACITRACIN ZINC 500 UNIT/GM EX OINT
TOPICAL_OINTMENT | CUTANEOUS | Status: DC | PRN
  Administered 2017-02-23: 1 via TOPICAL

## 2017-02-23 MED ORDER — DOCUSATE SODIUM 100 MG PO CAPS
100.0000 mg | ORAL_CAPSULE | Freq: Two times a day (BID) | ORAL | Status: DC
  Administered 2017-02-23: 100 mg via ORAL
  Filled 2017-02-23 (×2): qty 1

## 2017-02-23 MED ORDER — THROMBIN 20000 UNITS EX KIT
PACK | CUTANEOUS | Status: AC
  Filled 2017-02-23: qty 1

## 2017-02-23 MED ORDER — MANNITOL 25 % IV SOLN
INTRAVENOUS | Status: DC | PRN
  Administered 2017-02-23: 37.5 g via INTRAVENOUS

## 2017-02-23 MED ORDER — ETOMIDATE 2 MG/ML IV SOLN
INTRAVENOUS | Status: AC | PRN
  Administered 2017-02-23: 20 mg via INTRAVENOUS

## 2017-02-23 MED ORDER — ADULT MULTIVITAMIN W/MINERALS CH
1.0000 | ORAL_TABLET | Freq: Every day | ORAL | Status: DC
  Administered 2017-02-23 – 2017-02-24 (×2): 1 via ORAL
  Filled 2017-02-23 (×2): qty 1

## 2017-02-23 MED ORDER — FENTANYL CITRATE (PF) 250 MCG/5ML IJ SOLN
INTRAMUSCULAR | Status: DC | PRN
  Administered 2017-02-23 (×3): 50 ug via INTRAVENOUS
  Administered 2017-02-23: 100 ug via INTRAVENOUS

## 2017-02-23 MED ORDER — PROPOFOL 1000 MG/100ML IV EMUL
5.0000 ug/kg/min | INTRAVENOUS | Status: DC
  Administered 2017-02-23: 10 ug/kg/min via INTRAVENOUS

## 2017-02-23 MED ORDER — HYDROCODONE-ACETAMINOPHEN 5-325 MG PO TABS: 1.0000 | ORAL_TABLET | ORAL | Status: DC | PRN

## 2017-02-23 MED ORDER — CEFAZOLIN SODIUM-DEXTROSE 2-3 GM-%(50ML) IV SOLR
INTRAVENOUS | Status: DC | PRN
  Administered 2017-02-23: 2 g via INTRAVENOUS

## 2017-02-23 MED ORDER — VITAMIN B-1 100 MG PO TABS
100.0000 mg | ORAL_TABLET | Freq: Every day | ORAL | Status: DC
  Administered 2017-02-23: 100 mg via ORAL
  Filled 2017-02-23: qty 1

## 2017-02-23 MED ORDER — VITAL HIGH PROTEIN PO LIQD
1000.0000 mL | ORAL | Status: DC
  Administered 2017-02-23: 18:00:00
  Administered 2017-02-23 – 2017-02-24 (×2): 1000 mL
  Administered 2017-02-24 (×3)

## 2017-02-23 MED ORDER — 0.9 % SODIUM CHLORIDE (POUR BTL) OPTIME
TOPICAL | Status: DC | PRN
  Administered 2017-02-23: 1000 mL
  Administered 2017-02-23: 2000 mL

## 2017-02-23 MED ORDER — LORAZEPAM 2 MG/ML IJ SOLN
INTRAMUSCULAR | Status: AC
  Filled 2017-02-23: qty 1

## 2017-02-23 MED ORDER — SURGIFOAM 100 EX MISC
CUTANEOUS | Status: DC | PRN
  Administered 2017-02-23: 20 mL via TOPICAL

## 2017-02-23 MED ORDER — EPHEDRINE SULFATE 50 MG/ML IJ SOLN
INTRAMUSCULAR | Status: DC | PRN
  Administered 2017-02-23 (×3): 5 mg via INTRAVENOUS

## 2017-02-23 MED ORDER — ONDANSETRON HCL 4 MG PO TABS: 4.0000 mg | ORAL_TABLET | ORAL | Status: DC | PRN

## 2017-02-23 MED ORDER — ACETAMINOPHEN 650 MG RE SUPP
650.0000 mg | RECTAL | Status: DC | PRN
  Administered 2017-02-24: 650 mg via RECTAL
  Filled 2017-02-23: qty 1

## 2017-02-23 MED ORDER — LIDOCAINE-EPINEPHRINE 0.5 %-1:200000 IJ SOLN
INTRAMUSCULAR | Status: AC
  Filled 2017-02-23: qty 1

## 2017-02-23 MED ORDER — PRO-STAT SUGAR FREE PO LIQD
30.0000 mL | Freq: Two times a day (BID) | ORAL | Status: DC
  Administered 2017-02-23 – 2017-02-24 (×3): 30 mL
  Filled 2017-02-23 (×3): qty 30

## 2017-02-23 MED ORDER — HEPARIN SODIUM (PORCINE) 5000 UNIT/ML IJ SOLN
5000.0000 [IU] | Freq: Three times a day (TID) | INTRAMUSCULAR | Status: DC
  Administered 2017-02-25: 5000 [IU] via SUBCUTANEOUS
  Filled 2017-02-23: qty 1

## 2017-02-23 MED ORDER — SENNOSIDES-DOCUSATE SODIUM 8.6-50 MG PO TABS: 1.0000 | ORAL_TABLET | Freq: Every evening | ORAL | Status: DC | PRN

## 2017-02-23 MED ORDER — ORAL CARE MOUTH RINSE
15.0000 mL | OROMUCOSAL | Status: DC
  Administered 2017-02-23 – 2017-02-28 (×53): 15 mL via OROMUCOSAL

## 2017-02-23 MED ORDER — FENTANYL CITRATE (PF) 100 MCG/2ML IJ SOLN: 100.0000 ug | INTRAMUSCULAR | Status: DC | PRN

## 2017-02-23 MED ORDER — ONDANSETRON HCL 4 MG/2ML IJ SOLN: 4.0000 mg | INTRAMUSCULAR | Status: DC | PRN

## 2017-02-23 MED ORDER — PROPOFOL 1000 MG/100ML IV EMUL
INTRAVENOUS | Status: AC
  Filled 2017-02-23: qty 100

## 2017-02-23 MED ORDER — FOLIC ACID 1 MG PO TABS
1.0000 mg | ORAL_TABLET | Freq: Every day | ORAL | Status: DC
  Administered 2017-02-23: 1 mg via ORAL
  Filled 2017-02-23: qty 1

## 2017-02-23 MED ORDER — BISACODYL 10 MG RE SUPP: 10.0000 mg | Freq: Every day | RECTAL | Status: DC | PRN

## 2017-02-23 MED ORDER — SODIUM CHLORIDE 0.9 % IV BOLUS (SEPSIS)
1000.0000 mL | Freq: Once | INTRAVENOUS | Status: AC
  Administered 2017-02-23: 1000 mL via INTRAVENOUS

## 2017-02-23 MED ORDER — ROCURONIUM BROMIDE 100 MG/10ML IV SOLN
INTRAVENOUS | Status: DC | PRN
Start: 2017-02-23 — End: 2017-02-23
  Administered 2017-02-23: 50 mg via INTRAVENOUS
  Administered 2017-02-23: 30 mg via INTRAVENOUS
  Administered 2017-02-23: 50 mg via INTRAVENOUS

## 2017-02-23 MED ORDER — SODIUM CHLORIDE 0.9 % IV SOLN
500.0000 mg | Freq: Two times a day (BID) | INTRAVENOUS | Status: DC
  Administered 2017-02-23 – 2017-03-04 (×20): 500 mg via INTRAVENOUS
  Filled 2017-02-23 (×22): qty 5

## 2017-02-23 MED ORDER — CHLORHEXIDINE GLUCONATE 0.12% ORAL RINSE (MEDLINE KIT)
15.0000 mL | Freq: Two times a day (BID) | OROMUCOSAL | Status: DC
  Administered 2017-02-23 – 2017-02-28 (×12): 15 mL via OROMUCOSAL

## 2017-02-23 MED ORDER — PANTOPRAZOLE SODIUM 40 MG IV SOLR
40.0000 mg | Freq: Every day | INTRAVENOUS | Status: DC
  Administered 2017-02-23: 40 mg via INTRAVENOUS
  Filled 2017-02-23: qty 40

## 2017-02-23 SURGICAL SUPPLY — 82 items
BANDAGE GAUZE 4  KLING STR (GAUZE/BANDAGES/DRESSINGS) IMPLANT
BASKET BONE COLLECTION (BASKET) IMPLANT
BIT DRILL WIRE PASS 1.3MM (BIT) IMPLANT
BNDG GAUZE ELAST 4 BULKY (GAUZE/BANDAGES/DRESSINGS) ×6 IMPLANT
BNDG STRETCH 4X75 NS LF (GAUZE/BANDAGES/DRESSINGS) ×3 IMPLANT
BUR ACORN 6.0 PRECISION (BURR) ×2 IMPLANT
BUR ACORN 6.0MM PRECISION (BURR) ×1
BUR SPIRAL ROUTER 2.3 (BUR) IMPLANT
BUR SPIRAL ROUTER 2.3MM (BUR)
CANISTER SUCT 3000ML PPV (MISCELLANEOUS) ×3 IMPLANT
CARTRIDGE OIL MAESTRO DRILL (MISCELLANEOUS) ×1 IMPLANT
CATH ROBINSON RED A/P 12FR (CATHETERS) IMPLANT
CLIP RANEY DISP (INSTRUMENTS) ×3 IMPLANT
CLIP VESOCCLUDE MED 6/CT (CLIP) IMPLANT
DECANTER SPIKE VIAL GLASS SM (MISCELLANEOUS) ×3 IMPLANT
DIFFUSER DRILL AIR PNEUMATIC (MISCELLANEOUS) ×3 IMPLANT
DRAIN PENROSE 1/2X12 LTX STRL (WOUND CARE) IMPLANT
DRAPE NEUROLOGICAL W/INCISE (DRAPES) ×3 IMPLANT
DRAPE WARM FLUID 44X44 (DRAPE) ×3 IMPLANT
DRILL WIRE PASS 1.3MM (BIT)
DRSG PAD ABDOMINAL 8X10 ST (GAUZE/BANDAGES/DRESSINGS) IMPLANT
DURAPREP 6ML APPLICATOR 50/CS (WOUND CARE) ×3 IMPLANT
ELECT CAUTERY BLADE 6.4 (BLADE) ×3 IMPLANT
ELECT REM PT RETURN 9FT ADLT (ELECTROSURGICAL) ×3
ELECTRODE REM PT RTRN 9FT ADLT (ELECTROSURGICAL) ×1 IMPLANT
EVACUATOR SILICONE 100CC (DRAIN) IMPLANT
GAUZE SPONGE 4X4 12PLY STRL (GAUZE/BANDAGES/DRESSINGS) IMPLANT
GAUZE SPONGE 4X4 12PLY STRL LF (GAUZE/BANDAGES/DRESSINGS) ×3 IMPLANT
GAUZE SPONGE 4X4 16PLY XRAY LF (GAUZE/BANDAGES/DRESSINGS) IMPLANT
GLOVE BIO SURGEON STRL SZ8 (GLOVE) ×3 IMPLANT
GLOVE BIOGEL PI IND STRL 6.5 (GLOVE) ×1 IMPLANT
GLOVE BIOGEL PI IND STRL 7.5 (GLOVE) ×1 IMPLANT
GLOVE BIOGEL PI IND STRL 8 (GLOVE) ×1 IMPLANT
GLOVE BIOGEL PI IND STRL 8.5 (GLOVE) ×1 IMPLANT
GLOVE BIOGEL PI INDICATOR 6.5 (GLOVE) ×2
GLOVE BIOGEL PI INDICATOR 7.5 (GLOVE) ×2
GLOVE BIOGEL PI INDICATOR 8 (GLOVE) ×2
GLOVE BIOGEL PI INDICATOR 8.5 (GLOVE) ×2
GLOVE ECLIPSE 6.5 STRL STRAW (GLOVE) ×3 IMPLANT
GLOVE ECLIPSE 7.5 STRL STRAW (GLOVE) ×6 IMPLANT
GLOVE ECLIPSE 8.0 STRL XLNG CF (GLOVE) ×3 IMPLANT
GLOVE EXAM NITRILE LRG STRL (GLOVE) IMPLANT
GLOVE EXAM NITRILE XL STR (GLOVE) IMPLANT
GLOVE EXAM NITRILE XS STR PU (GLOVE) IMPLANT
GLOVE SURG SS PI 6.5 STRL IVOR (GLOVE) ×3 IMPLANT
GOWN STRL REUS W/ TWL LRG LVL3 (GOWN DISPOSABLE) ×2 IMPLANT
GOWN STRL REUS W/ TWL XL LVL3 (GOWN DISPOSABLE) ×1 IMPLANT
GOWN STRL REUS W/TWL 2XL LVL3 (GOWN DISPOSABLE) ×3 IMPLANT
GOWN STRL REUS W/TWL LRG LVL3 (GOWN DISPOSABLE) ×4
GOWN STRL REUS W/TWL XL LVL3 (GOWN DISPOSABLE) ×2
GRAFT DURAGEN MATRIX 2WX2L ×6 IMPLANT
HEMOSTAT SURGICEL 2X14 (HEMOSTASIS) ×3 IMPLANT
HOOK DURA 1/2IN (MISCELLANEOUS) ×3 IMPLANT
KIT BASIN OR (CUSTOM PROCEDURE TRAY) ×3 IMPLANT
KIT ROOM TURNOVER OR (KITS) ×3 IMPLANT
NEEDLE HYPO 25X1 1.5 SAFETY (NEEDLE) ×3 IMPLANT
NS IRRIG 1000ML POUR BTL (IV SOLUTION) ×6 IMPLANT
OIL CARTRIDGE MAESTRO DRILL (MISCELLANEOUS) ×3
PACK CRANIOTOMY (CUSTOM PROCEDURE TRAY) ×3 IMPLANT
PAD ARMBOARD 7.5X6 YLW CONV (MISCELLANEOUS) ×6 IMPLANT
PATTIES SURGICAL .5 X.5 (GAUZE/BANDAGES/DRESSINGS) IMPLANT
PATTIES SURGICAL .5 X3 (DISPOSABLE) IMPLANT
PATTIES SURGICAL 1X1 (DISPOSABLE) IMPLANT
PIN MAYFIELD SKULL DISP (PIN) ×3 IMPLANT
PLATE 1.5 5HOLE SQUARE (Plate) ×3 IMPLANT
SCREW SELF DRILL HT 1.5/4MM (Screw) ×60 IMPLANT
SPECIMEN JAR SMALL (MISCELLANEOUS) IMPLANT
SPONGE NEURO XRAY DETECT 1X3 (DISPOSABLE) IMPLANT
SPONGE SURGIFOAM ABS GEL 12-7 (HEMOSTASIS) IMPLANT
STAPLER SKIN PROX WIDE 3.9 (STAPLE) ×3 IMPLANT
SUT ETHILON 3 0 PS 1 (SUTURE) IMPLANT
SUT NURALON 4 0 TR CR/8 (SUTURE) ×9 IMPLANT
SUT VIC AB 2-0 CP2 18 (SUTURE) ×3 IMPLANT
SUT VIC AB 2-0 CT1 18 (SUTURE) ×3 IMPLANT
SUT VIC AB 3-0 SH 8-18 (SUTURE) IMPLANT
SYR CONTROL 10ML LL (SYRINGE) ×3 IMPLANT
TAPE CLOTH 1X10 TAN NS (GAUZE/BANDAGES/DRESSINGS) ×3 IMPLANT
TOWEL GREEN STERILE (TOWEL DISPOSABLE) ×3 IMPLANT
TOWEL GREEN STERILE FF (TOWEL DISPOSABLE) ×3 IMPLANT
TRAY FOLEY W/METER SILVER 16FR (SET/KITS/TRAYS/PACK) IMPLANT
UNDERPAD 30X30 (UNDERPADS AND DIAPERS) IMPLANT
WATER STERILE IRR 1000ML POUR (IV SOLUTION) ×3 IMPLANT
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
## 2017-02-23 NOTE — Anesthesia Postprocedure Evaluation (Signed)
Anesthesia Post Note  Patient: Nathaniel Murphy I Vandeusen  Procedure(s) Performed: CRANIOTOMY HEMATOMA EVACUATION SUBDURAL (Left Head)     Patient location during evaluation: SICU Anesthesia Type: General Level of consciousness: sedated Pain management: pain level controlled Vital Signs Assessment: post-procedure vital signs reviewed and stable Respiratory status: patient remains intubated per anesthesia plan Cardiovascular status: stable Postop Assessment: no apparent nausea or vomiting Anesthetic complications: no                 Ja Ohman DANIEL

## 2017-02-23 NOTE — Op Note (Signed)
02/23/2017  6:38 AM  PATIENT:  Gerri SporeYancey I Carvell  61 y.o. male with a acute subdural hematoma causing significant left to right mass effect.He was taken emergently to the operating room for hematoma evacuation.   PRE-OPERATIVE DIAGNOSIS:  Acute subdural hematoma, left  POST-OPERATIVE DIAGNOSIS:  Acute subdural hematoma, left   PROCEDURE:  Procedure(s):left frontotemporoparietal CRANIOTOMY for Subdural HEMATOMA EVACUATION  SURGEON: Surgeon(s): Coletta Memosabbell, Denetra Formoso, MD  ASSISTANTS:none  ANESTHESIA:   general  EBL:  Total I/O In: 1000 [I.V.:1000] Out: 300 [Urine:50; Blood:250]  BLOOD ADMINISTERED:none  COUNT:per nursing  DRAINS: none   SPECIMEN:  No Specimen  DICTATION: Gerri SporeYancey I Antonacci was taken to the operating room and placed under a general anesthetic without difficulty. He was positioned supine with cervical collar in place and a shoulder roll under the left shoulder and his body in semi lateral position. All pressure points were properly padded. Once adequate anesthesia was obtained I placed his head in a three pin Mayfield head holder. I attached the head holder to the OR bed. His head was shaved, prepped and draped in a sterile manner. I opened a reverse question mark incision to expose the frontotemporoparietal skull, and the temporalis muscle. I placed Raney clips on the scalp edges. I reflected the scalp rostrally, then divided the temporalis with monopolar cautery and reflected the flap rostrally with the scalp. I secured the flaps with fish hooks. With the skull exposed I created burr holes around the exposed skull , off the midline. I connected the burr holes with the craniotome and lifted the skull flap off the dura. I opened the dura with a 15 blade, then used scissors to complete the dural opening. I immediately encountered a large volume of clotted blood in the subdural space. I removed the blood with suction, and irrigation. I removed blood laterally, rostrally, caudally, and  medially. The brain was pulsatile when I completed the evacuation. I placed tack up sutures around the dural opening securing the dura to the skull. I then approximated the dura with neurolon sutures. I replaced the bone flap and secured it to the skull with plates and screws. I approximated the temporalis muscle to the muscle cuff and the plates with vicryl sutures, and I approximated the galea with vicryl sutures. The scalp edges were approximated with staples. I applied a sterile dressing. I removed the Mayfield head holder. He was then moved to the OR stretcher and brought to the ICU intubated.    PLAN OF CARE: Admit to inpatient   PATIENT DISPOSITION:  ICU - intubated and critically ill.   Delay start of Pharmacological VTE agent (>24hrs) due to surgical blood loss or risk of bleeding:  yes

## 2017-02-23 NOTE — Transfer of Care (Signed)
Immediate Anesthesia Transfer of Care Note  Patient: Nathaniel Murphy  Procedure(s) Performed: CRANIOTOMY HEMATOMA EVACUATION SUBDURAL (Left Head)  Patient Location: ICU  Anesthesia Type:General  Level of Consciousness: Patient remains intubated per anesthesia plan  Airway & Oxygen Therapy: Patient remains intubated per anesthesia plan and Patient placed on Ventilator (see vital sign flow sheet for setting)  Post-op Assessment: Report given to RN and Post -op Vital signs reviewed and stable  Post vital signs: Reviewed and stable  Last Vitals:  Vitals:   02/23/17 0300 02/23/17 0623  BP: (!) 125/97   Pulse: (!) 121 74  Resp: 19 14  Temp:    SpO2: 100%     Last Pain:  Vitals:   02/23/17 0053  TempSrc: Rectal         Complications: No apparent anesthesia complications

## 2017-02-23 NOTE — ED Notes (Signed)
Propofol infusion started at 195mcg/min.

## 2017-02-23 NOTE — Anesthesia Preprocedure Evaluation (Addendum)
Anesthesia Evaluation  Patient identified by MRN, date of birth, ID band Patient unresponsive    Reviewed: Unable to perform ROS - Chart review onlyPreop documentation limited or incomplete due to emergent nature of procedure.  Airway Mallampati: Intubated       Dental  (+) Poor Dentition   Pulmonary Current Smoker,    Pulmonary exam normal        Cardiovascular Normal cardiovascular exam     Neuro/Psych Anxiety    GI/Hepatic   Endo/Other    Renal/GU      Musculoskeletal   Abdominal   Peds  Hematology   Anesthesia Other Findings   Reproductive/Obstetrics                             Anesthesia Physical Anesthesia Plan  ASA: IV and emergent  Anesthesia Plan: General   Post-op Pain Management:    Induction:   PONV Risk Score and Plan: Treatment may vary due to age or medical condition  Airway Management Planned: Oral ETT  Additional Equipment:   Intra-op Plan:   Post-operative Plan: Post-operative intubation/ventilation  Informed Consent:   History available from chart only and Only emergency history available  Plan Discussed with: Anesthesiologist, CRNA and Surgeon  Anesthesia Plan Comments:        Anesthesia Quick Evaluation

## 2017-02-23 NOTE — Progress Notes (Signed)
eLink Physician-Brief Progress Note Patient Name: Nathaniel Murphy DOB: January 20, 1956 MRN: [id number]   Date of Service  02/23/2017  HPI/Events of Note  S/p surgery for large SDH.  eICU Interventions  Sedation ordered at request of neurosurgery     Intervention Category Intermediate Interventions: Other:  Henry RusselSMITH, Charita Lindenberger, Demetrius Charity 02/23/2017, 6:38 AM
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
## 2017-02-23 NOTE — H&P (Signed)
ccSubdural hematoma Nathaniel Murphy was found in bed incontinent of urine by his son at Mr. Nathaniel Murphy's home. He had fallen down the stairs at home on 10/19 but refused to go to the hospital. CT showed large acute subdural hematoma on the left. Also has C2 fracture. Nor responsive.  Allergies  Allergen Reactions  . Influenza Vaccines Diarrhea and Nausea And Vomiting    Arm swelling   Family History  Problem Relation Age of Onset  . Heart failure Mother   . Cancer Father   . Heart failure Brother    Social History   Social History  . Marital status: Divorced    Spouse name: N/A  . Number of children: N/A  . Years of education: N/A   Occupational History  . Not on file.   Social History Main Topics  . Smoking status: Current Every Day Smoker    Packs/day: 0.50    Types: Cigarettes  . Smokeless tobacco: Never Used  . Alcohol use 3.6 oz/week    6 Cans of beer per week     Comment: a fifth a day  . Drug use: Yes    Types: Marijuana     Comment: "a little every now and then"  . Sexual activity: Not on file   Other Topics Concern  . Not on file   Social History Narrative  . No narrative on file   Prior to Admission medications   Medication Sig Start Date End Date Taking? Authorizing Provider  alum & mag hydroxide-simeth (MAALOX/MYLANTA) 200-200-20 MG/5ML suspension Take 30 mLs by mouth every 6 (six) hours as needed for indigestion or heartburn.    [provider]  bismuth subsalicylate (PEPTO BISMOL) 262 MG/15ML suspension Take 30 mLs by mouth every 6 (six) hours as needed for indigestion.    [provider]  ibuprofen (ADVIL,MOTRIN) 200 MG tablet Take 400 mg by mouth every 4 (four) hours as needed (pain).    [provider]  Mouthwashes (ANTISEPTIC MOUTH RINSE MT) Use as directed 5 mLs in the mouth or throat as needed (pain/irritation).    [provider]  Multiple Vitamins-Minerals (CENTRUM ADULTS PO) Take 1 tablet by mouth daily.    [provider]  traMADol (ULTRAM) 50 MG tablet Take 1 tablet (50 mg total) by mouth every 6 (six) hours as needed. 11/21/16   Eyvonne MechanicHedges, Jeffrey, PA-C   History reviewed. No pertinent surgical history. Past Medical History:  Diagnosis Date  . Anxiety   . Back pain   . Pneumothorax   . Seizures (HCC)    Physical Exam  Constitutional: He appears well-developed and well-nourished.  HENT:  Head: Normocephalic.  Eyes: Pupils are equal, round, and reactive to light.  Neck:  In collar  Pulmonary/Chest:  Intubated, loose tooth  Abdominal: Soft.  Neurological: He is unresponsive.  Unresponsive perrl +corneals   Or for emergent craniotomy for subdural evacuation

## 2017-02-23 NOTE — Code Documentation (Signed)
Pt extubated and re-intubated by Dr. Bebe ShaggyWickline. X-ray at bedside.

## 2017-02-23 NOTE — ED Provider Notes (Signed)
Pt going to OR ABG noted, likely venous in nature Pulse ox 100% on vent Will transfer to OR in critical condition    Nathaniel Murphy, Nathaniel Hudnall, MD 02/23/17 [phone]

## 2017-02-23 NOTE — ED Triage Notes (Signed)
Per EMS, pt from home, fell down 7 wooden steps Friday night, son found pt lying in bed, incontinent of urine and only responsive to pain. Last known well, Friday. EMS vitals: BP-185/123, HR-100, SpO2-100% room air.

## 2017-02-23 NOTE — ED Notes (Signed)
Pt's pupils equal, sluggishly reactive to light.

## 2017-02-23 NOTE — ED Notes (Signed)
Delay in lab draw,  Pt not in room 

## 2017-02-23 NOTE — ED Provider Notes (Signed)
[facility] EMERGENCY DEPARTMENT Provider Note   CSN: [id number] Arrival date & time:        History   Chief Complaint Chief Complaint  Patient presents with  . Fall  . Altered Mental Status  LEVEL 5 CAVEAT DUE TO ACUITY OF CONDITION   HPI CIRO TASHIRO is a 61 y.o. male.  The history is provided by the EMS personnel and a relative. The history is limited by the condition of the patient.  Fall  This is a new problem. The current episode started 12 to 24 hours ago. The problem has been rapidly worsening. Nothing aggravates the symptoms. Nothing relieves the symptoms.  Altered Mental Status   This is a new problem. The current episode started 12 to 24 hours ago. The problem has been rapidly worsening. Associated symptoms include unresponsiveness.  PATIENTS PRESENTS S/P FALL OVER 24 HRS AGO PER SON, PT FELL DOWN STEPS LAST NIGHT HE WAS AWAKE/ALERT AT THE TIME BUT REFUSED TO BE EVALUATED THIS MORNING, SON WENT TO WORK, AND WHEN HE GOT HOME TONIGHT HE WAS LYING IN BED NOT RESPONDING HE HAS H/O ALCOHOL ABUSE NO OTHER DETAILS KNOWN AT ARRIVAL  Past Medical History:  Diagnosis Date  . Anxiety   . Back pain   . Pneumothorax   . Seizures (HCC)     There are no active problems to display for this patient.   History reviewed. No pertinent surgical history.     Home Medications    Prior to Admission medications   Medication Sig Start Date End Date Taking? Authorizing Provider  alum & mag hydroxide-simeth (MAALOX/MYLANTA) 200-200-20 MG/5ML suspension Take 30 mLs by mouth every 6 (six) hours as needed for indigestion or heartburn.    [provider]  bismuth subsalicylate (PEPTO BISMOL) 262 MG/15ML suspension Take 30 mLs by mouth every 6 (six) hours as needed for indigestion.    [provider]  ibuprofen (ADVIL,MOTRIN) 200 MG tablet Take 400 mg by mouth every 4 (four) hours as needed (pain).    [provider]  Mouthwashes  (ANTISEPTIC MOUTH RINSE MT) Use as directed 5 mLs in the mouth or throat as needed (pain/irritation).    [provider]  Multiple Vitamins-Minerals (CENTRUM ADULTS PO) Take 1 tablet by mouth daily.    [provider]  traMADol (ULTRAM) 50 MG tablet Take 1 tablet (50 mg total) by mouth every 6 (six) hours as needed. 11/21/16   Eyvonne Mechanic, PA-C    Family History Family History  Problem Relation Age of Onset  . Heart failure Mother   . Cancer Father   . Heart failure Brother     Social History Social History  Substance Use Topics  . Smoking status: Current Every Day Smoker    Packs/day: 0.50    Types: Cigarettes  . Smokeless tobacco: Never Used  . Alcohol use 3.6 oz/week    6 Cans of beer per week     Comment: a fifth a day     Allergies   Influenza vaccines   Review of Systems Review of Systems  Unable to perform ROS: Acuity of condition     Physical Exam Updated Vital Signs BP (!) 139/113   Pulse (!) 115   Temp 99.5 F (37.5 C) (Rectal)   Resp (!) 23   Ht 1.803 m (5\' 11" )   Wt 70 kg (154 lb 5.2 oz) Comment: from 2017 records  SpO2 100%   BMI 21.52 kg/m   Physical Exam  CONSTITUTIONAL: ill appearing, disheveled HEAD: Normocephalic/atraumatic, no lacerations noted EYES: eyes deviated to left.  Nystagmus.  Pupils equal/reactive ENMT: poor dentition NECK: no anterior neck bruising SPINE/BACK:No bruising/crepitance/stepoffs noted to spine Patient maintained in spinal precautions/logroll utilized CV: S1/S2 noted, no murmurs/rubs/gallops noted LUNGS: Lungs are clear to auscultation bilaterally, no apparent distress Chest - no bruising or crepitus ABDOMEN: soft, no obvious tenderness or bruising NEURO: Pt is somnolent.  He can localized pain.  GCS 7.  No verbal response.  He does not open his eyes  Extremities appear rigid with ?sz activity EXTREMITIES: blood noted on right great toe.  Pelvis stable All other extremities/joints  palpated/ranged and no deformities SKIN: warm, color normal PSYCH: unable to assess  ED Treatments / Results  Labs (all labs ordered are listed, but only abnormal results are displayed) Labs Reviewed  CBC - Abnormal; Notable for the following:       Result Value   WBC 11.7 (*)    RBC 4.14 (*)    All other components within normal limits  DIFFERENTIAL - Abnormal; Notable for the following:    Neutro Abs 9.8 (*)    Lymphs Abs 0.6 (*)    Monocytes Absolute 1.3 (*)    All other components within normal limits  COMPREHENSIVE METABOLIC PANEL - Abnormal; Notable for the following:    CO2 21 (*)    Glucose, Bld 122 (*)    Total Bilirubin 1.4 (*)    All other components within normal limits  CK - Abnormal; Notable for the following:    Total CK 716 (*)    All other components within normal limits  I-STAT CHEM 8, ED - Abnormal; Notable for the following:    Glucose, Bld 123 (*)    All other components within normal limits  URINE CULTURE  ETHANOL  PROTIME-INR  APTT  RAPID URINE DRUG SCREEN, HOSP PERFORMED  URINALYSIS, ROUTINE W REFLEX MICROSCOPIC  I-STAT TROPONIN, ED    EKG  EKG Interpretation  Date/Time:  Sunday February 23 2017 00:55:50 EDT Ventricular Rate:  132 PR Interval:    QRS Duration: 116 QT Interval:  317 QTC Calculation: 470 R Axis:   68 Text Interpretation:  Sinus tachycardia Left ventricular hypertrophy Probable inferior infarct, old Artifact in lead(s) I II III aVR aVL aVF V1 V3 V4 Interpretation limited secondary to artifact Confirmed by Zadie Rhine ([id number]) on 02/23/2017 1:29:24 AM       Radiology Ct Head Wo Contrast  Result Date: 02/23/2017 CLINICAL DATA:  61 y/o  M; status post fall. EXAM: CT HEAD WITHOUT CONTRAST CT CERVICAL SPINE WITHOUT CONTRAST TECHNIQUE: Multidetector CT imaging of the head and cervical spine was performed following the standard protocol without intravenous contrast. Multiplanar CT image reconstructions of the cervical spine  were also generated. COMPARISON:  None. FINDINGS: CT HEAD FINDINGS Brain: Large left-sided subdural hematoma over the left convexity measuring up to 20 mm in thickness within hemorrhage along the falx and left tentorium cerebelli. Small volume intraventricular hemorrhage pooling in occipital horns. Bilateral paramedian superior frontal parenchymal hemorrhage compatible cortical contusion. Mass effect results in 18 mm left-to-right midline shift, left-to-right subfalcine herniation, effacement of the quadrigeminal plate and suprasellar cisterns, effacement of left lateral ventricle, and right lateral ventricular enlargement likely representing entrapment. Vascular: No hyperdense vessel or unexpected calcification. Skull: Minimally displace right zygomatic maxillary complex fracture with fractures through the anterior posterior walls of the maxillary sinus and the right zygomatic arch. Minimally depressed fracture right nasal bone. Nondisplaced fracture of the  right frontal bone extending the right superior orbital rim, roof of orbit, and extending to the orbital apex. Sinuses/Orbits: No displaced orbital fracture or herniation of orbital contents. No intraorbital hemorrhage. Blood fluid level in the right maxillary sinus. Other: None. CT CERVICAL SPINE FINDINGS Alignment: As below. Skull base and vertebrae: Mildly displaced acute fracture of the C2 right lateral mass extending into pedicle and lamina as well as mildly displaced acute fracture of left lateral mass with transverse orientation. Posterior displacement of the C2 spinous process due to displacement of the lateral mass fractures. Additionally, there is a fracture through the anterior inferior margin of the dens and left uncinate process of the C3 vertebral body with 3 mm leftward subluxation of C2 on C3 and grade 1 3 mm anterolisthesis. Nondisplaced acute fracture of the left C5 inferior articular facet (series 8, image 58). Soft tissues and spinal canal:  Epidural hematoma is present at the C1 into levels anteriorly manifest by increased attenuation of the epidural space. At C2-3 the epidural hemorrhage results in mild canal stenosis (series 10, image 34). Additionally, there is extensive upper cervical prevertebral edema associated with the C2 fractures. Disc levels: Moderate cervical spondylosis with disc and facet arthropathy. Upper chest: Extensive bullous emphysema of lung apices. Other: Negative. IMPRESSION: CT head: 1. Large left convexity subdural hematoma, superomedial bifrontal cortical hemorrhagic contusions, and small volume of intraventricular hemorrhage. 2. Severe mass effect with 18 mm right-to-left midline shift, effacement of basilar cisterns, entrapped right lateral ventricle. 3. Nondisplaced right frontal bone fracture extending into roof of right orbit. 4. Mildly depressed right zygomatic maxillary complex fracture. CT cervical spine: 1. Comminuted fracture of C2 vertebral body involving both lateral masses, bilateral pedicles, and the right lamina. 2. Probable C2-3 disc fracture with grade 1 anterolisthesis and slight leftward C2 on C3 subluxation. 3. Nondisplaced fracture of left C5 inferior articular facet. 4. Epidural hematoma at the C1-2 levels with mild C2-3 canal stenosis. These results were called by telephone at the time of interpretation on 02/23/2017 at 1:41 am to Dr. Zadie RhineNALD Camyah Pultz , who verbally acknowledged these results. Electronically Signed   By: Mitzi HansenLance  Furusawa-Stratton M.D.   On: 02/23/2017 01:47   Ct Cervical Spine Wo Contrast  Result Date: 02/23/2017 CLINICAL DATA:  61 y/o  M; status post fall. EXAM: CT HEAD WITHOUT CONTRAST CT CERVICAL SPINE WITHOUT CONTRAST TECHNIQUE: Multidetector CT imaging of the head and cervical spine was performed following the standard protocol without intravenous contrast. Multiplanar CT image reconstructions of the cervical spine were also generated. COMPARISON:  None. FINDINGS: CT HEAD  FINDINGS Brain: Large left-sided subdural hematoma over the left convexity measuring up to 20 mm in thickness within hemorrhage along the falx and left tentorium cerebelli. Small volume intraventricular hemorrhage pooling in occipital horns. Bilateral paramedian superior frontal parenchymal hemorrhage compatible cortical contusion. Mass effect results in 18 mm left-to-right midline shift, left-to-right subfalcine herniation, effacement of the quadrigeminal plate and suprasellar cisterns, effacement of left lateral ventricle, and right lateral ventricular enlargement likely representing entrapment. Vascular: No hyperdense vessel or unexpected calcification. Skull: Minimally displace right zygomatic maxillary complex fracture with fractures through the anterior posterior walls of the maxillary sinus and the right zygomatic arch. Minimally depressed fracture right nasal bone. Nondisplaced fracture of the right frontal bone extending the right superior orbital rim, roof of orbit, and extending to the orbital apex. Sinuses/Orbits: No displaced orbital fracture or herniation of orbital contents. No intraorbital hemorrhage. Blood fluid level in the right maxillary sinus. Other: None. CT  CERVICAL SPINE FINDINGS Alignment: As below. Skull base and vertebrae: Mildly displaced acute fracture of the C2 right lateral mass extending into pedicle and lamina as well as mildly displaced acute fracture of left lateral mass with transverse orientation. Posterior displacement of the C2 spinous process due to displacement of the lateral mass fractures. Additionally, there is a fracture through the anterior inferior margin of the dens and left uncinate process of the C3 vertebral body with 3 mm leftward subluxation of C2 on C3 and grade 1 3 mm anterolisthesis. Nondisplaced acute fracture of the left C5 inferior articular facet (series 8, image 58). Soft tissues and spinal canal: Epidural hematoma is present at the C1 into levels  anteriorly manifest by increased attenuation of the epidural space. At C2-3 the epidural hemorrhage results in mild canal stenosis (series 10, image 34). Additionally, there is extensive upper cervical prevertebral edema associated with the C2 fractures. Disc levels: Moderate cervical spondylosis with disc and facet arthropathy. Upper chest: Extensive bullous emphysema of lung apices. Other: Negative. IMPRESSION: CT head: 1. Large left convexity subdural hematoma, superomedial bifrontal cortical hemorrhagic contusions, and small volume of intraventricular hemorrhage. 2. Severe mass effect with 18 mm right-to-left midline shift, effacement of basilar cisterns, entrapped right lateral ventricle. 3. Nondisplaced right frontal bone fracture extending into roof of right orbit. 4. Mildly depressed right zygomatic maxillary complex fracture. CT cervical spine: 1. Comminuted fracture of C2 vertebral body involving both lateral masses, bilateral pedicles, and the right lamina. 2. Probable C2-3 disc fracture with grade 1 anterolisthesis and slight leftward C2 on C3 subluxation. 3. Nondisplaced fracture of left C5 inferior articular facet. 4. Epidural hematoma at the C1-2 levels with mild C2-3 canal stenosis. These results were called by telephone at the time of interpretation on 02/23/2017 at 1:41 am to Dr. Zadie Rhine , who verbally acknowledged these results. Electronically Signed   By: Mitzi Hansen M.D.   On: 02/23/2017 01:47    Procedures Procedure Name: Intubation Date/Time: 02/23/2017 2:12 AM Performed by: Zadie Rhine Oxygen Delivery Method: Nasal cannula Preoxygenation: Pre-oxygenation with 100% oxygen Induction Type: Rapid sequence Laryngoscope Size: Glidescope Grade View: Grade I Tube size: 7.0 mm Number of attempts: 1 Placement Confirmation: ETT inserted through vocal cords under direct vision,  Breath sounds checked- equal and bilateral and CO2 detector       CRITICAL  CARE Performed by: Joya Gaskins Total critical care time: 45 minutes Critical care time was exclusive of separately billable procedures and treating other patients. Critical care was necessary to treat or prevent imminent or life-threatening deterioration. Critical care was time spent personally by me on the following activities: development of treatment plan with patient and/or surrogate as well as nursing, discussions with consultants, evaluation of patient's response to treatment, examination of patient, obtaining history from patient or surrogate, ordering and performing treatments and interventions, ordering and review of laboratory studies, ordering and review of radiographic studies, pulse oximetry and re-evaluation of patient's condition.   Medications Ordered in ED Medications  LORazepam (ATIVAN) 2 MG/ML injection (not administered)  propofol (DIPRIVAN) 1000 MG/100ML infusion (not administered)  propofol (DIPRIVAN) 1000 MG/100ML infusion (30 mcg/kg/min  70 kg Intravenous Rate/Dose Change 02/23/17 0301)  etomidate (AMIDATE) injection (20 mg Intravenous Given 02/23/17 0130)  succinylcholine (ANECTINE) injection (150 mg Intravenous Given 02/23/17 0131)  etomidate (AMIDATE) injection (20 mg Intravenous Given 02/23/17 0154)  succinylcholine (ANECTINE) injection (150 mg Intravenous Given 02/23/17 0155)     Initial Impression / Assessment and Plan / ED Course  I  have reviewed the triage vital signs and the nursing notes.  Pertinent labs & imaging results that were available during my care of the patient were reviewed by me and considered in my medical decision making (see chart for details).     2:13 AM Pt seen on arrival for AMS s/p fall 24 hrs ago Cervical collar ordered Unclear cause on arrival.  He had nystagmus/eye deviation, ?sz activity STAT Ct head obtained He has large subdural noted on CT head Pt intubated by Dr. Lester Kinsman, resident, without difficulty. However securing  tube and attempting NG Tube placement, it became dislodged.  I removed tube and utilized 7.0 tube and intubated patient without difficulty.  Pt tolerated well cspine precautions held throughout procedure Consulting neurosurgery Pt noted to have cervical spine fracture as well Updated son on plan 2:40 AM Pt stable Awaiting neurosurgery consult 3:07 AM D/w dr Venetia Maxon He will take patient emergently to the OR Pt resting comfortably Pupils equal/reactive BP (!) 125/97   Pulse (!) 121   Temp 99.5 F (37.5 C) (Rectal)   Resp 19   Ht 1.803 m (5\' 11" )   Wt 70 kg (154 lb 5.2 oz) Comment: from 2017 records  SpO2 100%   BMI 21.52 kg/m   Final Clinical Impressions(s) / ED Diagnoses   Final diagnoses:  Subdural hematoma (HCC)  Other closed displaced fracture of second cervical vertebra, initial encounter [facility])    New Prescriptions New Prescriptions   No medications on file     Zadie Rhine, MD 02/23/17 1610

## 2017-02-24 ENCOUNTER — Encounter (HOSPITAL_COMMUNITY): Payer: Self-pay | Admitting: Neurosurgery

## 2017-02-24 ENCOUNTER — Inpatient Hospital Stay (HOSPITAL_COMMUNITY): Payer: Medicaid Other

## 2017-02-24 DIAGNOSIS — J988 Other specified respiratory disorders: Secondary | ICD-10-CM

## 2017-02-24 DIAGNOSIS — S12100A Unspecified displaced fracture of second cervical vertebra, initial encounter for closed fracture: Secondary | ICD-10-CM

## 2017-02-24 LAB — BLOOD GAS, ARTERIAL
ACID-BASE DEFICIT: 0.1 mmol/L (ref 0.0–2.0)
Bicarbonate: 22.9 mmol/L (ref 20.0–28.0)
DRAWN BY: 39898
FIO2: 40
MECHVT: 600 mL
O2 Saturation: 98.8 %
PEEP/CPAP: 5 cmH2O
Patient temperature: 100.6
RATE: 14 resp/min
pCO2 arterial: 32 mmHg (ref 32.0–48.0)
pH, Arterial: 7.474 — ABNORMAL HIGH (ref 7.350–7.450)
pO2, Arterial: 127 mmHg — ABNORMAL HIGH (ref 83.0–108.0)

## 2017-02-24 LAB — BASIC METABOLIC PANEL
ANION GAP: 6 (ref 5–15)
BUN: 16 mg/dL (ref 6–20)
CHLORIDE: 113 mmol/L — AB (ref 101–111)
CO2: 23 mmol/L (ref 22–32)
Calcium: 8 mg/dL — ABNORMAL LOW (ref 8.9–10.3)
Creatinine, Ser: 1 mg/dL (ref 0.61–1.24)
GFR calc non Af Amer: >60 mL/min (ref >60)
Glucose, Bld: 113 mg/dL — ABNORMAL HIGH (ref 65–99)
Potassium: 3.7 mmol/L (ref 3.5–5.1)
Sodium: 142 mmol/L (ref 135–145)

## 2017-02-24 LAB — HEMOGLOBIN AND HEMATOCRIT, BLOOD
HCT: 21.5 % — ABNORMAL LOW (ref 39.0–52.0)
HEMOGLOBIN: 7 g/dL — AB (ref 13.0–17.0)

## 2017-02-24 LAB — GLUCOSE, CAPILLARY
GLUCOSE-CAPILLARY: 139 mg/dL — AB (ref 65–99)
GLUCOSE-CAPILLARY: 155 mg/dL — AB (ref 65–99)
GLUCOSE-CAPILLARY: 170 mg/dL — AB (ref 65–99)
Glucose-Capillary: 116 mg/dL — ABNORMAL HIGH (ref 65–99)
Glucose-Capillary: 127 mg/dL — ABNORMAL HIGH (ref 65–99)
Glucose-Capillary: 127 mg/dL — ABNORMAL HIGH (ref 65–99)
Glucose-Capillary: 130 mg/dL — ABNORMAL HIGH (ref 65–99)

## 2017-02-24 LAB — CBC
HCT: 22.7 % — ABNORMAL LOW (ref 39.0–52.0)
Hemoglobin: 7.5 g/dL — ABNORMAL LOW (ref 13.0–17.0)
MCH: 32.5 pg (ref 26.0–34.0)
MCHC: 33 g/dL (ref 30.0–36.0)
MCV: 98.3 fL (ref 78.0–100.0)
PLATELETS: 116 10*3/uL — AB (ref 150–400)
RBC: 2.31 MIL/uL — ABNORMAL LOW (ref 4.22–5.81)
RDW: 14.5 % (ref 11.5–15.5)
WBC: 10.8 10*3/uL — AB (ref 4.0–10.5)

## 2017-02-24 LAB — PHOSPHORUS
PHOSPHORUS: 1.3 mg/dL — AB (ref 2.5–4.6)
Phosphorus: 1.6 mg/dL — ABNORMAL LOW (ref 2.5–4.6)

## 2017-02-24 LAB — MAGNESIUM
MAGNESIUM: 2 mg/dL (ref 1.7–2.4)
Magnesium: 2 mg/dL (ref 1.7–2.4)

## 2017-02-24 MED ORDER — SODIUM CHLORIDE 0.9 % IV SOLN
INTRAVENOUS | Status: DC
  Administered 2017-02-24 – 2017-03-06 (×4): via INTRAVENOUS

## 2017-02-24 MED ORDER — VITAMIN B-1 100 MG PO TABS
100.0000 mg | ORAL_TABLET | Freq: Every day | ORAL | Status: DC
  Administered 2017-02-24 – 2017-03-04 (×8): 100 mg
  Filled 2017-02-24 (×8): qty 1

## 2017-02-24 MED ORDER — INSULIN ASPART 100 UNIT/ML ~~LOC~~ SOLN
0.0000 [IU] | SUBCUTANEOUS | Status: DC
  Administered 2017-02-24: 2 [IU] via SUBCUTANEOUS
  Administered 2017-02-24: 1 [IU] via SUBCUTANEOUS
  Administered 2017-02-24: 2 [IU] via SUBCUTANEOUS
  Administered 2017-02-24 – 2017-03-04 (×14): 1 [IU] via SUBCUTANEOUS

## 2017-02-24 MED ORDER — FOLIC ACID 1 MG PO TABS
1.0000 mg | ORAL_TABLET | Freq: Every day | ORAL | Status: DC
  Administered 2017-02-24 – 2017-03-04 (×8): 1 mg
  Filled 2017-02-24 (×8): qty 1

## 2017-02-24 MED ORDER — VITAL AF 1.2 CAL PO LIQD
1000.0000 mL | ORAL | Status: DC
  Administered 2017-02-24 – 2017-03-03 (×9): 1000 mL
  Filled 2017-02-24: qty 1000

## 2017-02-24 MED ORDER — ACETAMINOPHEN 160 MG/5ML PO SOLN
650.0000 mg | Freq: Four times a day (QID) | ORAL | Status: DC | PRN
  Administered 2017-02-24 – 2017-02-27 (×4): 650 mg
  Filled 2017-02-24 (×4): qty 20.3

## 2017-02-24 MED ORDER — K PHOS MONO-SOD PHOS DI & MONO 155-852-130 MG PO TABS
500.0000 mg | ORAL_TABLET | Freq: Three times a day (TID) | ORAL | Status: DC
  Administered 2017-02-25 (×4): 500 mg via ORAL
  Filled 2017-02-24 (×6): qty 2

## 2017-02-24 MED ORDER — K PHOS MONO-SOD PHOS DI & MONO 155-852-130 MG PO TABS
500.0000 mg | ORAL_TABLET | Freq: Three times a day (TID) | ORAL | Status: DC
  Filled 2017-02-24: qty 2

## 2017-02-24 MED ORDER — POTASSIUM & SODIUM PHOSPHATES 280-160-250 MG PO PACK: 2.0000 | PACK | Freq: Three times a day (TID) | ORAL | Status: DC

## 2017-02-24 MED ORDER — DOCUSATE SODIUM 50 MG/5ML PO LIQD
100.0000 mg | Freq: Two times a day (BID) | ORAL | Status: DC
  Administered 2017-02-24 – 2017-03-01 (×7): 100 mg
  Filled 2017-02-24 (×8): qty 10

## 2017-02-24 MED ORDER — ADULT MULTIVITAMIN LIQUID CH
15.0000 mL | Freq: Every day | ORAL | Status: DC
  Administered 2017-02-25 – 2017-03-04 (×7): 15 mL
  Filled 2017-02-24 (×9): qty 15

## 2017-02-24 MED ORDER — IPRATROPIUM-ALBUTEROL 0.5-2.5 (3) MG/3ML IN SOLN
3.0000 mL | Freq: Four times a day (QID) | RESPIRATORY_TRACT | Status: DC | PRN
  Administered 2017-02-27: 3 mL via RESPIRATORY_TRACT
  Filled 2017-02-24: qty 3

## 2017-02-24 MED ORDER — PANTOPRAZOLE SODIUM 40 MG PO PACK
40.0000 mg | PACK | Freq: Every day | ORAL | Status: DC
  Administered 2017-02-24 – 2017-03-04 (×8): 40 mg
  Filled 2017-02-24 (×8): qty 20

## 2017-02-24 NOTE — Progress Notes (Signed)
Initial Nutrition Assessment  INTERVENTION:   Vital AF 1.2 start @ 40 ml/hr and increase by 10 ml every 12 hours to goal rate of 70 ml/hr Provides: 2016 kcal, 126 grams protein, and 1362 ml free water.   Monitor magnesium and phosphorus every 12 hours x 4 occurances, MD to replete as needed, as pt is at risk for refeeding syndrome given hx of ETOH abuse.   NUTRITION DIAGNOSIS:   Inadequate oral intake related to inability to eat as evidenced by NPO status.  GOAL:   Patient will meet greater than or equal to 90% of their needs  MONITOR:   TF tolerance, I & O's  REASON FOR ASSESSMENT:   Consult, Ventilator Enteral/tube feeding initiation and management  ASSESSMENT:   Pt with PMH of ETOH abuse admitted s/p fall with SDH, C2 fracture, s/p crani 10/21.    Pt discussed during ICU rounds and with RN.  No nutrition hx available at this time. Pt lives with 60 year old son. Per family report pt has always drank ETOH.  Suspect malnutrition in pt with hx of ETOH.   Patient is currently intubated on ventilator support MV: 14.8 L/min Temp (24hrs), Avg:100.4 F (38 C), Min:99.6 F (37.6 C), Max:100.8 F (38.2 C)  Medications reviewed and include: colace, folic acid, MVI, thiamine  Labs reviewed: PO4 1.6 (L) CBG's: 139-130-116  TF: Vital High Protein @ 40 ml/hr with 30 ml Prostat BID started 10/21  Diet Order:    NPO  Skin:  Reviewed, no issues  Last BM:  unknown  Height:   Ht Readings from Last 1 Encounters:  02/23/17 5\' 11"  (1.803 m)    Weight:   Wt Readings from Last 1 Encounters:  02/24/17 130 lb 1.1 oz (59 kg)    Ideal Body Weight:  78.1 kg  BMI:  Body mass index is 18.14 kg/m.  Estimated Nutritional Needs:   Kcal:  2000  Protein:  90-115 grams  Fluid:  > 2 L/day  EDUCATION NEEDS:   No education needs identified at this time  Kendell BaneHeather Ediberto Sens RD, LDN, CNSC [phone] Pager [phone] After Hours Pager

## 2017-02-24 NOTE — Plan of Care (Signed)
Problem: Activity: Goal: Ability to tolerate increased activity will improve Outcome: Progressing Pt weaned on vent most of the day.   Problem: Nutritional: Goal: Intake of prescribed amount of daily calories will improve Outcome: Progressing Continuing to increase Tube Feed every 12 hrs to goal rate of 70ml
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
## 2017-02-24 NOTE — Progress Notes (Signed)
Patient ID: Nathaniel Murphy, male   DOB: 1955-10-11, 61 y.o.   MRN: [id number] BP 95/63   Pulse 77   Temp (!) 100.5 F (38.1 C) (Axillary)   Resp 20   Ht 5\' 11"  (1.803 m)   Wt 59 kg (130 lb 1.1 oz)   SpO2 100%   BMI 18.14 kg/m  Intubated, sedated. Localizing with left upper extremity to noxious stimuli.  Right pupil small reactive, left pupil ~843mm reactive. Tube feeds started, will watch blood sugars closely Will have sedation decreased

## 2017-02-24 NOTE — Progress Notes (Signed)
eLink Physician-Brief Progress Note Patient Name: Gerri SporeYancey I Scearce DOB: 1955/09/02 MRN: [id number]   Date of Service  02/24/2017  HPI/Events of Note    eICU Interventions  Hypophos-repleted      Intervention Category Minor Interventions: Electrolytes abnormality - evaluation and management  Cloma Rahrig V. 02/24/2017, 10:39 PM

## 2017-02-24 NOTE — Progress Notes (Signed)
eLink Physician-Brief Progress Note Patient Name: Nathaniel SporeYancey I Murphy DOB: 11-25-1955 MRN: [id number]   Date of Service  02/24/2017  HPI/Events of Note  Blood sugars in 180s  eICU Interventions  Order SSI coverage     Intervention Category Evaluation Type: Other  Nathaniel Murphy 02/24/2017, 12:13 AM

## 2017-02-25 ENCOUNTER — Inpatient Hospital Stay (HOSPITAL_COMMUNITY): Payer: Medicaid Other

## 2017-02-25 LAB — BASIC METABOLIC PANEL
Anion gap: 4 — ABNORMAL LOW (ref 5–15)
Anion gap: 7 (ref 5–15)
BUN: 11 mg/dL (ref 6–20)
BUN: 12 mg/dL (ref 6–20)
CALCIUM: 8.4 mg/dL — AB (ref 8.9–10.3)
CO2: 25 mmol/L (ref 22–32)
CO2: 21 mmol/L — ABNORMAL LOW (ref 22–32)
CREATININE: 0.94 mg/dL (ref 0.61–1.24)
Calcium: 8.1 mg/dL — ABNORMAL LOW (ref 8.9–10.3)
Chloride: 113 mmol/L — ABNORMAL HIGH (ref 101–111)
Chloride: 115 mmol/L — ABNORMAL HIGH (ref 101–111)
Creatinine, Ser: 0.79 mg/dL (ref 0.61–1.24)
GFR calc Af Amer: >60 mL/min (ref >60)
GFR calc non Af Amer: >60 mL/min (ref >60)
GFR calc non Af Amer: >60 mL/min (ref >60)
Glucose, Bld: 114 mg/dL — ABNORMAL HIGH (ref 65–99)
Glucose, Bld: 125 mg/dL — ABNORMAL HIGH (ref 65–99)
Potassium: 3.1 mmol/L — ABNORMAL LOW (ref 3.5–5.1)
Potassium: 3.4 mmol/L — ABNORMAL LOW (ref 3.5–5.1)
SODIUM: 142 mmol/L (ref 135–145)
Sodium: 143 mmol/L (ref 135–145)

## 2017-02-25 LAB — CBC
HCT: 19.3 % — ABNORMAL LOW (ref 39.0–52.0)
Hemoglobin: 6.4 g/dL — CL (ref 13.0–17.0)
MCH: 32.7 pg (ref 26.0–34.0)
MCHC: 33.2 g/dL (ref 30.0–36.0)
MCV: 98.5 fL (ref 78.0–100.0)
Platelets: 120 10*3/uL — ABNORMAL LOW (ref 150–400)
RBC: 1.96 MIL/uL — AB (ref 4.22–5.81)
RDW: 14.3 % (ref 11.5–15.5)
WBC: 12.4 10*3/uL — ABNORMAL HIGH (ref 4.0–10.5)

## 2017-02-25 LAB — GLUCOSE, CAPILLARY
GLUCOSE-CAPILLARY: 106 mg/dL — AB (ref 65–99)
GLUCOSE-CAPILLARY: 124 mg/dL — AB (ref 65–99)
GLUCOSE-CAPILLARY: 133 mg/dL — AB (ref 65–99)
Glucose-Capillary: 118 mg/dL — ABNORMAL HIGH (ref 65–99)
Glucose-Capillary: 126 mg/dL — ABNORMAL HIGH (ref 65–99)
Glucose-Capillary: 128 mg/dL — ABNORMAL HIGH (ref 65–99)

## 2017-02-25 LAB — OCCULT BLOOD X 1 CARD TO LAB, STOOL: Fecal Occult Bld: NEGATIVE

## 2017-02-25 LAB — PREPARE RBC (CROSSMATCH)

## 2017-02-25 LAB — PHOSPHORUS: Phosphorus: 2 mg/dL — ABNORMAL LOW (ref 2.5–4.6)

## 2017-02-25 LAB — HEMOGLOBIN AND HEMATOCRIT, BLOOD
HCT: 26.9 % — ABNORMAL LOW (ref 39.0–52.0)
Hemoglobin: 8.9 g/dL — ABNORMAL LOW (ref 13.0–17.0)

## 2017-02-25 MED ORDER — SODIUM CHLORIDE 0.9 % IV SOLN
Freq: Once | INTRAVENOUS | Status: AC
  Administered 2017-02-25: 09:00:00 via INTRAVENOUS

## 2017-02-25 NOTE — Consult Note (Addendum)
Reason for Consult:S/P fall, ABL anemia Referring Physician: Merton Border is an 61 y.o. male.  HPI: Nathaniel Murphy reportedly fell down the stairs at home 11/19 but refused medical evaluaiton. He was found by his son 11/21 in bed, incontinent of urine. He was evaluated in the ED and found to have a large L SDH and B frontal ICC, C2 body FX, C5 facet FX, R facial FXs involving the orbit, frontal bone, zygoma, and maxillary sinus. He was admitted and taken to the operating room for emergent craniotomy by Dr. Christella Noa. Postoperatively, he remained on the ventilator. CCM has been managing this and he has been weaning. His hemoglobin yesterday was noted to be 7.5. It has drifted down to 6.4 today and I was asked to see him for further trauma evaluation. He has not been hypotensive. He remains on the ventilator and cannot contribute to the history.  Past Medical History:  Diagnosis Date  . Anxiety   . Back pain   . Pneumothorax   . Seizures ([place])     Past Surgical History:  Procedure Laterality Date  . CRANIOTOMY Left 02/23/2017   Procedure: CRANIOTOMY HEMATOMA EVACUATION SUBDURAL;  Surgeon: Ashok Pall, MD;  Location: [facility];  Service: Neurosurgery;  Laterality: Left;    Family History  Problem Relation Age of Onset  . Heart failure Mother   . Cancer Father   . Heart failure Brother     Social History:  reports that he has been smoking Cigarettes.  He has been smoking about 0.50 packs per day. He has never used smokeless tobacco. He reports that he drinks about 3.6 oz of alcohol per week . He reports that he uses drugs, including Marijuana.  Allergies:  Allergies  Allergen Reactions  . Influenza Vaccines Diarrhea and Nausea And Vomiting    Arm swelling    Medications: I have reviewed the patient's current medications.  Results for orders placed or performed during the hospital encounter of 02/23/17 (from the past 48 hour(s))  Magnesium     Status: None   Collection Time:  02/23/17  2:12 PM  Result Value Ref Range   Magnesium 1.7 1.7 - 2.4 mg/dL  Phosphorus     Status: None   Collection Time: 02/23/17  2:12 PM  Result Value Ref Range   Phosphorus 3.1 2.5 - 4.6 mg/dL  Glucose, capillary     Status: Abnormal   Collection Time: 02/23/17  4:30 PM  Result Value Ref Range   Glucose-Capillary 132 (H) 65 - 99 mg/dL  Magnesium     Status: None   Collection Time: 02/23/17  5:53 PM  Result Value Ref Range   Magnesium 1.7 1.7 - 2.4 mg/dL  Phosphorus     Status: None   Collection Time: 02/23/17  5:53 PM  Result Value Ref Range   Phosphorus 2.7 2.5 - 4.6 mg/dL  Glucose, capillary     Status: Abnormal   Collection Time: 02/23/17  8:04 PM  Result Value Ref Range   Glucose-Capillary 182 (H) 65 - 99 mg/dL  Glucose, capillary     Status: Abnormal   Collection Time: 02/24/17 12:06 AM  Result Value Ref Range   Glucose-Capillary 170 (H) 65 - 99 mg/dL  Blood gas, arterial     Status: Abnormal   Collection Time: 02/24/17  3:15 AM  Result Value Ref Range   FIO2 40.00    Delivery systems VENTILATOR    Mode PRESSURE REGULATED VOLUME CONTROL    VT 600  mL   LHR 14 resp/min   Peep/cpap 5.0 cm H20   pH, Arterial 7.474 (H) 7.350 - 7.450   pCO2 arterial 32.0 32.0 - 48.0 mmHg   pO2, Arterial 127 (H) 83.0 - 108.0 mmHg   Bicarbonate 22.9 20.0 - 28.0 mmol/L   Acid-base deficit 0.1 0.0 - 2.0 mmol/L   O2 Saturation 98.8 %   Patient temperature 100.6    Collection site LEFT RADIAL    Drawn by [phone]    Sample type ARTERIAL DRAW    Allens test (pass/fail) PASS PASS  Glucose, capillary     Status: Abnormal   Collection Time: 02/24/17  3:54 AM  Result Value Ref Range   Glucose-Capillary 139 (H) 65 - 99 mg/dL  CBC     Status: Abnormal   Collection Time: 02/24/17  5:32 AM  Result Value Ref Range   WBC 10.8 (H) 4.0 - 10.5 K/uL   RBC 2.31 (L) 4.22 - 5.81 MIL/uL   Hemoglobin 7.5 (L) 13.0 - 17.0 g/dL    Comment: REPEATED TO VERIFY   HCT 22.7 (L) 39.0 - 52.0 %   MCV 98.3 78.0  - 100.0 fL   MCH 32.5 26.0 - 34.0 pg   MCHC 33.0 30.0 - 36.0 g/dL   RDW 67.1 48.2 - 30.9 %   Platelets 116 (L) 150 - 400 K/uL    Comment: PLATELET COUNT CONFIRMED BY SMEAR  Basic metabolic panel     Status: Abnormal   Collection Time: 02/24/17  5:32 AM  Result Value Ref Range   Sodium 142 135 - 145 mmol/L   Potassium 3.7 3.5 - 5.1 mmol/L   Chloride 113 (H) 101 - 111 mmol/L   CO2 23 22 - 32 mmol/L   Glucose, Bld 113 (H) 65 - 99 mg/dL   BUN 16 6 - 20 mg/dL   Creatinine, Ser 6.23 0.61 - 1.24 mg/dL   Calcium 8.0 (L) 8.9 - 10.3 mg/dL   GFR calc non Af Amer >60 >60 mL/min   GFR calc Af Amer >60 >60 mL/min    Comment: (NOTE) The eGFR has been calculated using the CKD EPI equation. This calculation has not been validated in all clinical situations. eGFR's persistently <60 mL/min signify possible Chronic Kidney Disease.    Anion gap 6 5 - 15  Magnesium     Status: None   Collection Time: 02/24/17  5:32 AM  Result Value Ref Range   Magnesium 2.0 1.7 - 2.4 mg/dL  Phosphorus     Status: Abnormal   Collection Time: 02/24/17  5:32 AM  Result Value Ref Range   Phosphorus 1.6 (L) 2.5 - 4.6 mg/dL  Glucose, capillary     Status: Abnormal   Collection Time: 02/24/17  8:37 AM  Result Value Ref Range   Glucose-Capillary 130 (H) 65 - 99 mg/dL   Comment 1 Notify RN    Comment 2 Document in Chart   Glucose, capillary     Status: Abnormal   Collection Time: 02/24/17 12:42 PM  Result Value Ref Range   Glucose-Capillary 116 (H) 65 - 99 mg/dL   Comment 1 Notify RN    Comment 2 Document in Chart   Glucose, capillary     Status: Abnormal   Collection Time: 02/24/17  4:49 PM  Result Value Ref Range   Glucose-Capillary 155 (H) 65 - 99 mg/dL   Comment 1 Notify RN    Comment 2 Document in Chart   Magnesium     Status: None  Collection Time: 02/24/17  6:49 PM  Result Value Ref Range   Magnesium 2.0 1.7 - 2.4 mg/dL  Phosphorus     Status: Abnormal   Collection Time: 02/24/17  6:49 PM  Result  Value Ref Range   Phosphorus 1.3 (L) 2.5 - 4.6 mg/dL  Hemoglobin and hematocrit, blood     Status: Abnormal   Collection Time: 02/24/17  6:49 PM  Result Value Ref Range   Hemoglobin 7.0 (L) 13.0 - 17.0 g/dL   HCT 21.5 (L) 39.0 - 52.0 %  Glucose, capillary     Status: Abnormal   Collection Time: 02/24/17  8:08 PM  Result Value Ref Range   Glucose-Capillary 127 (H) 65 - 99 mg/dL  Glucose, capillary     Status: Abnormal   Collection Time: 02/24/17 11:48 PM  Result Value Ref Range   Glucose-Capillary 127 (H) 65 - 99 mg/dL  Glucose, capillary     Status: Abnormal   Collection Time: 02/25/17  3:57 AM  Result Value Ref Range   Glucose-Capillary 106 (H) 65 - 99 mg/dL  Basic metabolic panel     Status: Abnormal   Collection Time: 02/25/17  5:19 AM  Result Value Ref Range   Sodium 142 135 - 145 mmol/L   Potassium 3.1 (L) 3.5 - 5.1 mmol/L   Chloride 113 (H) 101 - 111 mmol/L   CO2 25 22 - 32 mmol/L   Glucose, Bld 114 (H) 65 - 99 mg/dL   BUN 11 6 - 20 mg/dL   Creatinine, Ser 0.94 0.61 - 1.24 mg/dL   Calcium 8.4 (L) 8.9 - 10.3 mg/dL   GFR calc non Af Amer >60 >60 mL/min   GFR calc Af Amer >60 >60 mL/min    Comment: (NOTE) The eGFR has been calculated using the CKD EPI equation. This calculation has not been validated in all clinical situations. eGFR's persistently <60 mL/min signify possible Chronic Kidney Disease.    Anion gap 4 (L) 5 - 15  CBC     Status: Abnormal   Collection Time: 02/25/17  5:19 AM  Result Value Ref Range   WBC 12.4 (H) 4.0 - 10.5 K/uL    Comment: REPEATED TO VERIFY   RBC 1.96 (L) 4.22 - 5.81 MIL/uL   Hemoglobin 6.4 (LL) 13.0 - 17.0 g/dL    Comment: REPEATED TO VERIFY CRITICAL RESULT CALLED TO, READ BACK BY AND VERIFIED WITH: Dillard Essex RN [phone] 02/25/17 BY MACEDA, J.    HCT 19.3 (L) 39.0 - 52.0 %   MCV 98.5 78.0 - 100.0 fL   MCH 32.7 26.0 - 34.0 pg   MCHC 33.2 30.0 - 36.0 g/dL   RDW 14.3 11.5 - 15.5 %   Platelets 120 (L) 150 - 400 K/uL    Comment:  REPEATED TO VERIFY  Phosphorus     Status: Abnormal   Collection Time: 02/25/17  5:19 AM  Result Value Ref Range   Phosphorus 2.0 (L) 2.5 - 4.6 mg/dL  Prepare RBC     Status: None   Collection Time: 02/25/17  8:01 AM  Result Value Ref Range   Order Confirmation ORDER PROCESSED BY BLOOD BANK   Glucose, capillary     Status: Abnormal   Collection Time: 02/25/17  8:15 AM  Result Value Ref Range   Glucose-Capillary 124 (H) 65 - 99 mg/dL   Comment 1 Notify RN    Comment 2 Document in Chart   Occult blood card to lab, stool RN will collect  Status: None   Collection Time: 02/25/17  9:31 AM  Result Value Ref Range   Fecal Occult Bld NEGATIVE NEGATIVE  Glucose, capillary     Status: Abnormal   Collection Time: 02/25/17 11:46 AM  Result Value Ref Range   Glucose-Capillary 118 (H) 65 - 99 mg/dL   Comment 1 Notify RN    Comment 2 Document in Chart     Ct Head Wo Contrast  Result Date: 02/25/2017 CLINICAL DATA:  Subdural hematoma.  Head trauma. EXAM: CT HEAD WITHOUT CONTRAST TECHNIQUE: Contiguous axial images were obtained from the base of the skull through the vertex without intravenous contrast. COMPARISON:  CT head without contrast 02/23/2017 FINDINGS: Brain: Following a right frontal craniotomy, there is significant evacuation of the left subdural hematoma. Some mixed extra-axial blood products remain. Bilateral pneumocephalus is present. Right frontal hemorrhagic contusion is stable in location. There is evolving edema about the right frontal parenchymal hemorrhage. No new areas of hemorrhage are present. Interventricular hemorrhage is again noted within the posterior horn of the right lateral ventricle without hydrocephalus. Midline shift is significantly improved, now measuring 4 mm left to right at the foramen of Monro. Blood products are again noted along the tentorium. The brainstem and cerebellum are normal. Vascular: No hyperdense vessel or unexpected calcification. Skull: Left  frontal craniotomy is noted. A right supraorbital rim fracture extends posteriorly to the orbital apex. New fracture extends superiorly in the lateral aspect of the right frontal bone. A segmental right zygomatic arch fracture is present. No new fractures are seen. Sinuses/Orbits: Mild diffuse mucosal thickening is present in the ethmoid air cells. There is blood layering in the right maxillary sinus is associated with the anterior maxillary fracture. Minimally displaced fractures at the frontal process of the maxilla is again noted bilaterally. Minimal fluid is present in the left maxillary sinus without associated fracture. The right zygomatic arch fracture is again seen. IMPRESSION: 1. Interval evacuation of left subdural hematoma. Left frontal craniotomy. 2. Some residual mixed density blood products remain within the extra-axial space. 3. Marked decrease in mass effect and midline shift. 4. Expected evolution of right frontal hemorrhagic contusion. 5. Right supraorbital fracture is stable. 6. Right maxillary and zygomatic arch fractures are stable. Electronically Signed   By: San Morelle M.D.   On: 02/25/2017 11:14   Dg Chest Port 1 View  Result Date: 02/25/2017 CLINICAL DATA:  Respiratory failure. EXAM: PORTABLE CHEST 1 VIEW COMPARISON:  02/24/2017.  02/23/2017. FINDINGS: Endotracheal tube and NG tube in stable position. Heart size stable. Low lung volumes with mild basilar atelectasis. Mild basilar interstitial prominence noted. Mild interstitial edema or pneumonitis could present this fashion. No significant change from prior exam. Severe bullous changes consistent COPD again noted. IMPRESSION: 1.  Lines and tubes in stable position. 2. Low lung volumes. Persistent bibasilar pulmonary interstitial prominence suggesting interstitial edema or pneumonitis. No change from prior exam. 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 Electronically Signed   By: Marcello Moores  Register   On: 02/25/2017  07:32   Dg Chest Port 1 View  Result Date: 02/24/2017 CLINICAL DATA:  61 year old male with head injury. Subsequent encounter. EXAM: PORTABLE CHEST 1 VIEW COMPARISON:  02/23/2017 and 12/26/2014 chest x-ray FINDINGS: Endotracheal tube tip 5.7 cm above the carina. Nasogastric tube courses below the diaphragm. Tip is not included on the present exam. Prominent emphysema with central pulmonary vascular prominence. Basilar crowding of lung markings versus atelectasis. Basilar infiltrate secondary less likely consideration. Heart size within normal limits. Calcified  aorta. IMPRESSION: Prominent emphysema with central pulmonary vascular prominence. Basilar crowding of lung markings versus atelectasis. Basilar infiltrate secondary less likely consideration. Aortic Atherosclerosis (ICD10-I70.0). Electronically Signed   By: Genia Del M.D.   On: 02/24/2017 07:37    Review of Systems  Unable to perform ROS: Intubated   Blood pressure (!) 145/84, pulse 66, temperature 99.8 F (37.7 C), temperature source Axillary, resp. rate 17, height '5\' 11"'$  (1.803 m), weight 62.7 kg (138 lb 3.7 oz), SpO2 100 %. Physical Exam  Constitutional: He appears well-developed and well-nourished. No distress.  HENT:  Right Ear: External ear normal.  Left Ear: External ear normal.  Mouth/Throat: Oropharynx is clear and moist.  Oral ETT, R facial edema and tenderness, dressing on craniotomy site  Eyes: Pupils are equal, round, and reactive to light. Right eye exhibits no discharge. Left eye exhibits no discharge.  Neck:  Cervical collar in place  Cardiovascular: Normal rate, regular rhythm, normal heart sounds and intact distal pulses.   Respiratory: Effort normal and breath sounds normal. No respiratory distress. He has no wheezes. He has no rales.  GI: Soft. Bowel sounds are normal. He exhibits no distension. There is no tenderness. There is no rebound and no guarding.  Musculoskeletal: He exhibits no tenderness or  deformity.  Neurological: He is alert. He displays no atrophy and no tremor. He exhibits normal muscle tone. He displays seizure activity. GCS eye subscore is 3. GCS verbal subscore is 1. GCS motor subscore is 6.  GCS 10T, follows commands with upper and lower extremities  Skin: Skin is warm.    Assessment/Plan: S/P fall Acute blood loss anemia - I suspect he was significantly dehydrated when admitted and then has lost blood from his SDH, craniotomy, and facial fractures. I suspect he is equilibrating at this time with no further active bleeding. I did a bedside FAST which showed no evidence of intra-abdominal hemorrhage. His abdominal exam is benign. He does not have a base deficit and has not been hypotensive. Agree with blood transfusion. I have canceled his formal ultrasound. If his hemoglobin does not respond appropriately to transfusion, further imaging with CT chest/abdomen/pelvis can be considered. We will follow. C2 body and C5 facet FX - collar per Dr. Christella Noa R frontal bone, orbit, maxillary sinus and zygoma FXs - I consulted Dr. Erik Obey and he will see him in consultation Vent dependent acute hypoxic resp failure - weaning per CCM. He is following commands and should be able to extubate soon  Sharlot Sturkey E 02/25/2017, 2:11 PM

## 2017-02-25 NOTE — Procedures (Signed)
FAST  Pre-procedure diagnosis:S/P fall 10/19, Hb drop Post-procedure diagnosis:no significant free fluid in the abdomen, no pericardial effusion Procedure: FAST Surgeon: Violeta GelinasBurke Arsenio Schnorr, MD Procedure in detail: The patient's abdomen was imaged in 4 regions with the ultrasound. First, the right upper quadrant was imaged. No free fluid was seen between the right kidney and the liver in Morison's pouch. Next, the epigastrium was imaged. No significant pericardial effusion was seen. Next, the left upper quadrant was imaged. No free fluid was seen between the left kidney and the spleen. Finally, the bladder was imaged. No free fluid was seen next to the bladder in the pelvis.             Impression: Negative  Violeta GelinasBurke Danee Soller, MD, MPH, FACS Trauma: [phone] General Surgery: [phone]

## 2017-02-25 NOTE — Progress Notes (Signed)
CRITICAL VALUE ALERT  Critical Value:  6.4 hgb  Date & Time Notied:  10/23 0755   Provider Notified: Franky Machoabbell   Orders Received/Actions taken: Transfuse 2 units

## 2017-02-25 NOTE — Progress Notes (Signed)
Patient ID: Nathaniel Murphy, male   DOB: 06/14/1955, 61 y.o.   MRN: [id number] BP 137/71   Pulse 65   Temp 99.3 F (37.4 C) (Axillary)   Resp (!) 22   Ht 5\' 11"  (1.803 m)   Wt 62.7 kg (138 lb 3.7 oz)   SpO2 100%   BMI 19.28 kg/m  Follows some commands Moving left upper extremity Repeat ct improved, less shift, basal cisterns are patent Continues to wean. Received a transfusion today.  Appreciate trauma consult.
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
## 2017-02-26 ENCOUNTER — Inpatient Hospital Stay (HOSPITAL_COMMUNITY): Payer: Medicaid Other

## 2017-02-26 LAB — BPAM RBC
BLOOD PRODUCT EXPIRATION DATE: 201811152359
BLOOD PRODUCT EXPIRATION DATE: 201811152359
Blood Product Expiration Date: 201811152359
ISSUE DATE / TIME: 201810221400
ISSUE DATE / TIME: 201810230904
ISSUE DATE / TIME: 201810231202
Unit Type and Rh: 5100
Unit Type and Rh: 5100
Unit Type and Rh: 5100

## 2017-02-26 LAB — BASIC METABOLIC PANEL
ANION GAP: 7 (ref 5–15)
BUN: 12 mg/dL (ref 6–20)
CHLORIDE: 113 mmol/L — AB (ref 101–111)
CO2: 25 mmol/L (ref 22–32)
Calcium: 8.6 mg/dL — ABNORMAL LOW (ref 8.9–10.3)
Creatinine, Ser: 0.92 mg/dL (ref 0.61–1.24)
GFR calc non Af Amer: >60 mL/min (ref >60)
Glucose, Bld: 114 mg/dL — ABNORMAL HIGH (ref 65–99)
Potassium: 3.3 mmol/L — ABNORMAL LOW (ref 3.5–5.1)
Sodium: 145 mmol/L (ref 135–145)

## 2017-02-26 LAB — BLOOD GAS, ARTERIAL
Acid-base deficit: 0 mmol/L (ref 0.0–2.0)
Bicarbonate: 23.7 mmol/L (ref 20.0–28.0)
Drawn by: 519031
FIO2: 30
Mode: POSITIVE
O2 Saturation: 97.6 %
PEEP: 5 cmH2O
Patient temperature: 98.6
Pressure support: 8 cmH2O
pCO2 arterial: 35.8 mmHg (ref 32.0–48.0)
pH, Arterial: 7.436 (ref 7.350–7.450)
pO2, Arterial: 99.1 mmHg (ref 83.0–108.0)

## 2017-02-26 LAB — TYPE AND SCREEN
ABO/RH(D): O POS
ANTIBODY SCREEN: NEGATIVE
UNIT DIVISION: 0
UNIT DIVISION: 0
Unit division: 0

## 2017-02-26 LAB — IRON AND TIBC
IRON: 11 ug/dL — AB (ref 45–182)
Saturation Ratios: 5 % — ABNORMAL LOW (ref 17.9–39.5)
TIBC: 217 ug/dL — ABNORMAL LOW (ref 250–450)
UIBC: 206 ug/dL

## 2017-02-26 LAB — MAGNESIUM: Magnesium: 2 mg/dL (ref 1.7–2.4)

## 2017-02-26 LAB — GLUCOSE, CAPILLARY
GLUCOSE-CAPILLARY: 140 mg/dL — AB (ref 65–99)
GLUCOSE-CAPILLARY: 137 mg/dL — AB (ref 65–99)
Glucose-Capillary: 112 mg/dL — ABNORMAL HIGH (ref 65–99)
Glucose-Capillary: 113 mg/dL — ABNORMAL HIGH (ref 65–99)
Glucose-Capillary: 133 mg/dL — ABNORMAL HIGH (ref 65–99)

## 2017-02-26 LAB — FERRITIN: Ferritin: 222 ng/mL (ref 24–336)

## 2017-02-26 LAB — PHOSPHORUS: PHOSPHORUS: 3.5 mg/dL (ref 2.5–4.6)

## 2017-02-26 MED ORDER — DEXMEDETOMIDINE HCL IN NACL 200 MCG/50ML IV SOLN
0.4000 ug/kg/h | INTRAVENOUS | Status: DC
  Administered 2017-02-27 (×5): 0.8 ug/kg/h via INTRAVENOUS
  Administered 2017-02-28: 0.6 ug/kg/h via INTRAVENOUS
  Administered 2017-02-28 (×2): 0.8 ug/kg/h via INTRAVENOUS
  Administered 2017-02-28: 0.4 ug/kg/h via INTRAVENOUS
  Administered 2017-03-01 (×2): 0.7 ug/kg/h via INTRAVENOUS
  Administered 2017-03-01: 0.4 ug/kg/h via INTRAVENOUS
  Filled 2017-02-26 (×15): qty 50

## 2017-02-26 MED ORDER — POTASSIUM CHLORIDE 20 MEQ/15ML (10%) PO SOLN
40.0000 meq | Freq: Once | ORAL | Status: AC
  Administered 2017-02-26: 40 meq
  Filled 2017-02-26: qty 30

## 2017-02-26 MED ORDER — POTASSIUM CHLORIDE 10 MEQ/100ML IV SOLN
10.0000 meq | INTRAVENOUS | Status: DC
  Administered 2017-02-26: 10 meq via INTRAVENOUS
  Filled 2017-02-26: qty 100
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
## 2017-02-26 NOTE — Progress Notes (Signed)
eLink Physician-Brief Progress Note Patient Name: Nathaniel Murphy DOB: [birth year] MRN: [id number]   Date of Service  02/26/2017  HPI/Events of Note  K+ = 3.3 and Creatinine = 0.92.  eICU Interventions  Will replace K+.     Intervention Category Major Interventions: Electrolyte abnormality - evaluation and management  [name] 02/26/2017, 6:32 AM
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
## 2017-02-26 NOTE — Progress Notes (Signed)
Patient ID: Nathaniel Murphy I Southern, male   DOB: 1955-12-24, 61 y.o.   MRN: [id number] BP (!) 147/80   Pulse 80   Temp (!) 101.4 F (38.6 C) (Axillary)   Resp (!) 27   Ht 5\' 11"  (1.803 m)   Wt 63.7 kg (140 lb 6.9 oz)   SpO2 98%   BMI 19.59 kg/m  Patient ventilated Has followed commands Unlikely anemia is due to surgery as craniotomies unless there is continued bleeding are never a large source of blood loss. The subdural blood was no longer in the blood stream, thus removing it bears no influence on the hematocrit. Awaiting extubation.  Moving extremities on left.

## 2017-02-26 NOTE — Progress Notes (Signed)
Trauma Service Note  Subjective: Patient currently not following commands and weaning on the ventilator.  CCM is following the patient.  Objective: Vital signs in last 24 hours: Temp:  [98.4 F (36.9 C)-101.2 F (38.4 C)] 101.2 F (38.4 C) (10/24 0807) Pulse Rate:  [58-81] 79 (10/24 0807) Resp:  [10-23] 19 (10/24 0807) BP: (99-157)/(61-105) 157/88 (10/24 0807) SpO2:  [99 %-100 %] 100 % (10/24 0807) FiO2 (%):  [30 %] 30 % (10/24 0807) Weight:  [63.7 kg (140 lb 6.9 oz)] 63.7 kg (140 lb 6.9 oz) (10/24 0500) Last BM Date: 02/25/17  Intake/Output from previous day: 10/23 0701 - 10/24 0700 In: 3514.1 [I.V.:1433.3; Blood:570; NG/GT:1300.8; IV Piggyback:210] Out: 965 [Urine:965] Intake/Output this shift: No intake/output data recorded.  General: No acute distress  Lungs: Clear to auscultation.  RML and RLL potential infiltrates.    Abd: Benign.  Tolerating low rate tube feedings.  Extremities: No changes  Neuro: Will need full assessment neurologically after the patient gets extubated.  Lab Results: CBC   Recent Labs  02/24/17 0532  02/25/17 0519 02/25/17 1635  WBC 10.8*  --  12.4*  --   HGB 7.5*  < > 6.4* 8.9*  HCT 22.7*  < > 19.3* 26.9*  PLT 116*  --  120*  --   < > = values in this interval not displayed. BMET  Recent Labs  02/25/17 1635 02/26/17 0236  NA 143 145  K 3.4* 3.3*  CL 115* 113*  CO2 21* 25  GLUCOSE 125* 114*  BUN 12 12  CREATININE 0.79 0.92  CALCIUM 8.1* 8.6*   PT/INR No results for input(s): LABPROT, INR in the last 72 hours. ABG  Recent Labs  02/24/17 0315  PHART 7.474*  HCO3 22.9    Studies/Results: Ct Head Wo Contrast  Result Date: 02/25/2017 CLINICAL DATA:  Subdural hematoma.  Head trauma. EXAM: CT HEAD WITHOUT CONTRAST TECHNIQUE: Contiguous axial images were obtained from the base of the skull through the vertex without intravenous contrast. COMPARISON:  CT head without contrast 02/23/2017 FINDINGS: Brain: Following a right  frontal craniotomy, there is significant evacuation of the left subdural hematoma. Some mixed extra-axial blood products remain. Bilateral pneumocephalus is present. Right frontal hemorrhagic contusion is stable in location. There is evolving edema about the right frontal parenchymal hemorrhage. No new areas of hemorrhage are present. Interventricular hemorrhage is again noted within the posterior horn of the right lateral ventricle without hydrocephalus. Midline shift is significantly improved, now measuring 4 mm left to right at the foramen of Monro. Blood products are again noted along the tentorium. The brainstem and cerebellum are normal. Vascular: No hyperdense vessel or unexpected calcification. Skull: Left frontal craniotomy is noted. A right supraorbital rim fracture extends posteriorly to the orbital apex. New fracture extends superiorly in the lateral aspect of the right frontal bone. A segmental right zygomatic arch fracture is present. No new fractures are seen. Sinuses/Orbits: Mild diffuse mucosal thickening is present in the ethmoid air cells. There is blood layering in the right maxillary sinus is associated with the anterior maxillary fracture. Minimally displaced fractures at the frontal process of the maxilla is again noted bilaterally. Minimal fluid is present in the left maxillary sinus without associated fracture. The right zygomatic arch fracture is again seen. IMPRESSION: 1. Interval evacuation of left subdural hematoma. Left frontal craniotomy. 2. Some residual mixed density blood products remain within the extra-axial space. 3. Marked decrease in mass effect and midline shift. 4. Expected evolution of right frontal hemorrhagic  contusion. 5. Right supraorbital fracture is stable. 6. Right maxillary and zygomatic arch fractures are stable. Electronically Signed   By: Marin Robertshristopher  Mattern M.D.   On: 02/25/2017 11:14   Dg Chest Port 1 View  Result Date: 02/26/2017 CLINICAL DATA:   Respiratory failure.  Endotracheal tube present. EXAM: PORTABLE CHEST 1 VIEW COMPARISON:  02/25/2017 FINDINGS: Endotracheal tube is 5.4 cm above the carina. Nasogastric tube extends into the abdomen but the tip is beyond the image. Heart size is within normal limits and stable. Marked lucency in the right lung compatible with emphysema. Patchy densities at the lung bases may represent atelectasis and minimally changed. Negative for pneumothorax. IMPRESSION: No acute chest findings. Few densities at the lung bases are likely associated with atelectasis. Support apparatuses as described. Emphysematous changes. Electronically Signed   By: Richarda OverlieAdam  Henn M.D.   On: 02/26/2017 07:56   Dg Chest Port 1 View  Result Date: 02/25/2017 CLINICAL DATA:  Respiratory failure. EXAM: PORTABLE CHEST 1 VIEW COMPARISON:  02/24/2017.  02/23/2017. FINDINGS: Endotracheal tube and NG tube in stable position. Heart size stable. Low lung volumes with mild basilar atelectasis. Mild basilar interstitial prominence noted. Mild interstitial edema or pneumonitis could present this fashion. No significant change from prior exam. Severe bullous changes consistent COPD again noted. IMPRESSION: 1.  Lines and tubes in stable position. 2. Low lung volumes. Persistent bibasilar pulmonary interstitial prominence suggesting interstitial edema or pneumonitis. No change from prior exam. 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 Electronically Signed   By: Maisie Fushomas  Register   On: 02/25/2017 07:32    Anti-infectives: Anti-infectives    None      Assessment/Plan: s/p Procedure(s): CRANIOTOMY HEMATOMA EVACUATION SUBDURAL Continue to follow.  LOS: 3 days   Marta LamasJames O. Gae BonWyatt, III, MD, FACS [phone] Trauma Surgeon 02/26/2017

## 2017-02-27 ENCOUNTER — Inpatient Hospital Stay (HOSPITAL_COMMUNITY): Payer: Medicaid Other

## 2017-02-27 LAB — BASIC METABOLIC PANEL
ANION GAP: 10 (ref 5–15)
BUN: 9 mg/dL (ref 6–20)
CALCIUM: 8.4 mg/dL — AB (ref 8.9–10.3)
CO2: 24 mmol/L (ref 22–32)
CREATININE: 0.8 mg/dL (ref 0.61–1.24)
Chloride: 109 mmol/L (ref 101–111)
GFR calc Af Amer: >60 mL/min (ref >60)
GLUCOSE: 114 mg/dL — AB (ref 65–99)
Potassium: 3.3 mmol/L — ABNORMAL LOW (ref 3.5–5.1)
Sodium: 143 mmol/L (ref 135–145)

## 2017-02-27 LAB — GLUCOSE, CAPILLARY
GLUCOSE-CAPILLARY: 112 mg/dL — AB (ref 65–99)
Glucose-Capillary: 109 mg/dL — ABNORMAL HIGH (ref 65–99)
Glucose-Capillary: 115 mg/dL — ABNORMAL HIGH (ref 65–99)
Glucose-Capillary: 116 mg/dL — ABNORMAL HIGH (ref 65–99)
Glucose-Capillary: 134 mg/dL — ABNORMAL HIGH (ref 65–99)

## 2017-02-27 LAB — CBC
HCT: 23.4 % — ABNORMAL LOW (ref 39.0–52.0)
HCT: 23.1 % — ABNORMAL LOW (ref 39.0–52.0)
HEMOGLOBIN: 7.9 g/dL — AB (ref 13.0–17.0)
HEMOGLOBIN: 7.6 g/dL — AB (ref 13.0–17.0)
MCH: 31.5 pg (ref 26.0–34.0)
MCH: 30.8 pg (ref 26.0–34.0)
MCHC: 33.8 g/dL (ref 30.0–36.0)
MCHC: 32.9 g/dL (ref 30.0–36.0)
MCV: 93.2 fL (ref 78.0–100.0)
MCV: 93.5 fL (ref 78.0–100.0)
PLATELETS: 170 10*3/uL (ref 150–400)
PLATELETS: 186 10*3/uL (ref 150–400)
RBC: 2.51 MIL/uL — ABNORMAL LOW (ref 4.22–5.81)
RBC: 2.47 MIL/uL — AB (ref 4.22–5.81)
RDW: 15.3 % (ref 11.5–15.5)
RDW: 15 % (ref 11.5–15.5)
WBC: 9.5 10*3/uL (ref 4.0–10.5)
WBC: 8.2 10*3/uL (ref 4.0–10.5)

## 2017-02-27 LAB — IRON AND TIBC
IRON: 10 ug/dL — AB (ref 45–182)
Saturation Ratios: 5 % — ABNORMAL LOW (ref 17.9–39.5)
TIBC: 213 ug/dL — ABNORMAL LOW (ref 250–450)
UIBC: 203 ug/dL

## 2017-02-27 LAB — FERRITIN: Ferritin: 236 ng/mL (ref 24–336)

## 2017-02-27 MED ORDER — CHLORHEXIDINE GLUCONATE CLOTH 2 % EX PADS
6.0000 | MEDICATED_PAD | Freq: Every day | CUTANEOUS | Status: DC
  Administered 2017-02-27: 6 via TOPICAL

## 2017-02-27 MED ORDER — IOPAMIDOL (ISOVUE-300) INJECTION 61%
INTRAVENOUS | Status: AC
  Administered 2017-02-27: 100 mL
  Filled 2017-02-27: qty 100

## 2017-02-27 MED ORDER — HYDRALAZINE HCL 20 MG/ML IJ SOLN
10.0000 mg | INTRAMUSCULAR | Status: DC | PRN
  Administered 2017-02-27 – 2017-03-05 (×8): 10 mg via INTRAVENOUS
  Filled 2017-02-27 (×8): qty 1

## 2017-02-27 MED ORDER — QUETIAPINE FUMARATE 50 MG PO TABS
25.0000 mg | ORAL_TABLET | Freq: Two times a day (BID) | ORAL | Status: DC
  Administered 2017-02-27 – 2017-03-03 (×7): 25 mg
  Filled 2017-02-27 (×7): qty 1

## 2017-02-27 MED ORDER — IBUPROFEN 100 MG/5ML PO SUSP
400.0000 mg | Freq: Four times a day (QID) | ORAL | Status: DC | PRN
  Administered 2017-02-27 – 2017-03-19 (×4): 400 mg via ORAL
  Filled 2017-02-27 (×6): qty 20

## 2017-02-27 MED ORDER — FUROSEMIDE 10 MG/ML IJ SOLN
40.0000 mg | Freq: Once | INTRAMUSCULAR | Status: AC
  Administered 2017-02-27: 40 mg via INTRAVENOUS
  Filled 2017-02-27: qty 4

## 2017-02-27 MED ORDER — CLONAZEPAM 0.1 MG/ML ORAL SUSPENSION
0.5000 mg | Freq: Two times a day (BID) | ORAL | Status: DC
Start: 2017-02-27 — End: 2017-02-27
  Filled 2017-02-27: qty 5

## 2017-02-27 MED ORDER — CLONAZEPAM 0.5 MG PO TABS
0.5000 mg | ORAL_TABLET | Freq: Two times a day (BID) | ORAL | Status: DC
  Administered 2017-02-27 – 2017-03-05 (×10): 0.5 mg
  Filled 2017-02-27 (×10): qty 1

## 2017-02-27 MED ORDER — SODIUM CHLORIDE 0.9% FLUSH: 10.0000 mL | INTRAVENOUS | Status: DC | PRN

## 2017-02-27 MED ORDER — METOPROLOL TARTRATE 25 MG/10 ML ORAL SUSPENSION
25.0000 mg | Freq: Two times a day (BID) | ORAL | Status: DC
  Administered 2017-02-27 – 2017-03-03 (×7): 25 mg
  Filled 2017-02-27 (×7): qty 10

## 2017-02-27 MED ORDER — SODIUM CHLORIDE 0.9% FLUSH
10.0000 mL | Freq: Two times a day (BID) | INTRAVENOUS | Status: DC
  Administered 2017-02-27 – 2017-02-28 (×2): 20 mL
  Administered 2017-03-01 – 2017-03-09 (×14): 10 mL
  Administered 2017-03-09: 20 mL
  Administered 2017-03-10 – 2017-03-18 (×14): 10 mL

## 2017-02-27 NOTE — Progress Notes (Signed)
Peripherally Inserted Central Catheter/Midline Placement  The IV Nurse has discussed with the patient and/or persons authorized to consent for the patient, the purpose of this procedure and the potential benefits and risks involved with this procedure.  The benefits include less needle sticks, lab draws from the catheter, and the patient may be discharged home with the catheter. Risks include, but not limited to, infection, bleeding, blood clot (thrombus formation), and puncture of an artery; nerve damage and irregular heartbeat and possibility to perform a PICC exchange if needed/ordered by physician.  Alternatives to this procedure were also discussed.  Bard Power PICC patient education guide, fact sheet on infection prevention and patient information card has been provided to patient /or left at bedside.    PICC/Midline Placement Documentation  PICC Double Lumen 02/27/17 PICC Right Basilic 48 cm 0 cm (Active)  Indication for Insertion or Continuance of Line Prolonged intravenous therapies 02/27/2017 10:00 AM  Exposed Catheter (cm) 0 cm 02/27/2017 10:00 AM  Site Assessment Clean;Dry;Intact 02/27/2017 10:00 AM  Lumen #1 Status Flushed;Blood return noted 02/27/2017 10:00 AM  Lumen #2 Status Flushed;Blood return noted 02/27/2017 10:00 AM  Dressing Type Transparent 02/27/2017 10:00 AM  Dressing Status Clean;Dry;Intact;Antimicrobial disc in place 02/27/2017 10:00 AM  Dressing Intervention New dressing 02/27/2017 10:00 AM  Dressing Change Due 03/06/17 02/27/2017 10:00 AM       Stacie GlazeJoyce, Kimberleigh Mehan Horton 02/27/2017, 11:01 AM

## 2017-02-27 NOTE — Progress Notes (Signed)
Patient ID: Gerri SporeYancey I Seaborn, male   DOB: 11/23/1955, 61 y.o.   MRN: [id number] L wrist FX - I consulted Dr. Jena GaussHaddix. Violeta GelinasBurke Sandara Tyree, MD, MPH, FACS Trauma: [phone] General Surgery: [phone]

## 2017-02-27 NOTE — Progress Notes (Signed)
1000 Meds being passed now. Unable to pass meds before now due to extended PICC line placement followed by scheduled CT scan at 1130.
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
## 2017-02-28 LAB — GLUCOSE, CAPILLARY
GLUCOSE-CAPILLARY: 105 mg/dL — AB (ref 65–99)
GLUCOSE-CAPILLARY: 106 mg/dL — AB (ref 65–99)
GLUCOSE-CAPILLARY: 61 mg/dL — AB (ref 65–99)
GLUCOSE-CAPILLARY: 74 mg/dL (ref 65–99)
GLUCOSE-CAPILLARY: 86 mg/dL (ref 65–99)
GLUCOSE-CAPILLARY: 93 mg/dL (ref 65–99)
Glucose-Capillary: 84 mg/dL (ref 65–99)

## 2017-02-28 LAB — CBC
HEMATOCRIT: 25 % — AB (ref 39.0–52.0)
HEMOGLOBIN: 8.2 g/dL — AB (ref 13.0–17.0)
MCH: 30.8 pg (ref 26.0–34.0)
MCHC: 32.8 g/dL (ref 30.0–36.0)
MCV: 94 fL (ref 78.0–100.0)
Platelets: 237 10*3/uL (ref 150–400)
RBC: 2.66 MIL/uL — AB (ref 4.22–5.81)
RDW: 15.1 % (ref 11.5–15.5)
WBC: 11.6 10*3/uL — AB (ref 4.0–10.5)

## 2017-02-28 LAB — BASIC METABOLIC PANEL
Anion gap: 9 (ref 5–15)
BUN: 13 mg/dL (ref 6–20)
CHLORIDE: 102 mmol/L (ref 101–111)
CO2: 26 mmol/L (ref 22–32)
CREATININE: 0.7 mg/dL (ref 0.61–1.24)
Calcium: 8.7 mg/dL — ABNORMAL LOW (ref 8.9–10.3)
GFR calc non Af Amer: >60 mL/min (ref >60)
Glucose, Bld: 134 mg/dL — ABNORMAL HIGH (ref 65–99)
POTASSIUM: 3.2 mmol/L — AB (ref 3.5–5.1)
SODIUM: 137 mmol/L (ref 135–145)

## 2017-02-28 MED ORDER — GLYCOPYRROLATE 0.2 MG/ML IJ SOLN: 0.2000 mg | Freq: Three times a day (TID) | INTRAMUSCULAR | Status: DC

## 2017-02-28 MED ORDER — DEXTROSE 50 % IV SOLN
INTRAVENOUS | Status: AC
  Administered 2017-02-28: 25 mL via INTRAVENOUS
  Filled 2017-02-28: qty 50

## 2017-02-28 MED ORDER — CHLORHEXIDINE GLUCONATE 0.12 % MT SOLN
15.0000 mL | Freq: Two times a day (BID) | OROMUCOSAL | Status: DC
  Administered 2017-03-01 – 2017-03-22 (×41): 15 mL via OROMUCOSAL
  Filled 2017-02-28 (×36): qty 15

## 2017-02-28 MED ORDER — ACETAMINOPHEN 325 MG PO TABS
650.0000 mg | ORAL_TABLET | Freq: Four times a day (QID) | ORAL | Status: DC | PRN
  Administered 2017-03-19 – 2017-03-25 (×5): 650 mg via ORAL
  Filled 2017-02-28 (×6): qty 2

## 2017-02-28 MED ORDER — PIPERACILLIN-TAZOBACTAM 3.375 G IVPB
3.3750 g | Freq: Three times a day (TID) | INTRAVENOUS | Status: DC
  Administered 2017-02-28 – 2017-03-06 (×18): 3.375 g via INTRAVENOUS
  Filled 2017-02-28 (×20): qty 50

## 2017-02-28 MED ORDER — VANCOMYCIN HCL IN DEXTROSE 750-5 MG/150ML-% IV SOLN
750.0000 mg | Freq: Three times a day (TID) | INTRAVENOUS | Status: DC
  Administered 2017-02-28 – 2017-03-06 (×17): 750 mg via INTRAVENOUS
  Filled 2017-02-28 (×18): qty 150

## 2017-02-28 MED ORDER — DEXTROSE 50 % IV SOLN
25.0000 mL | Freq: Once | INTRAVENOUS | Status: AC
  Administered 2017-02-28 (×2): 25 mL via INTRAVENOUS

## 2017-02-28 MED ORDER — VANCOMYCIN HCL 10 G IV SOLR
1250.0000 mg | Freq: Once | INTRAVENOUS | Status: AC
  Administered 2017-02-28: 1250 mg via INTRAVENOUS
  Filled 2017-02-28: qty 1250

## 2017-02-28 MED ORDER — ORAL CARE MOUTH RINSE
15.0000 mL | Freq: Two times a day (BID) | OROMUCOSAL | Status: DC
  Administered 2017-03-01 – 2017-03-26 (×45): 15 mL via OROMUCOSAL

## 2017-02-28 MED ORDER — ACETAMINOPHEN 650 MG RE SUPP
650.0000 mg | Freq: Four times a day (QID) | RECTAL | Status: DC | PRN
  Administered 2017-02-28: 650 mg via RECTAL
  Filled 2017-02-28: qty 1

## 2017-02-28 MED ORDER — ACETAMINOPHEN 160 MG/5ML PO SOLN
650.0000 mg | Freq: Four times a day (QID) | ORAL | Status: DC | PRN
  Administered 2017-03-01 – 2017-03-03 (×4): 650 mg
  Filled 2017-02-28 (×4): qty 20.3

## 2017-02-28 MED ORDER — GLYCOPYRROLATE 0.2 MG/ML IJ SOLN
0.2000 mg | Freq: Three times a day (TID) | INTRAMUSCULAR | Status: AC
  Administered 2017-02-28 – 2017-03-02 (×5): 0.2 mg via INTRAVENOUS
  Filled 2017-02-28 (×5): qty 1

## 2017-02-28 NOTE — Progress Notes (Signed)
Orthopedic Tech Progress Note Patient Details:  Nathaniel SporeYancey I Murphy [birth year] [id number]  Ortho Devices Type of Ortho Device: Ace wrap, Volar splint Ortho Device/Splint Interventions: Application   [name] 02/28/2017, 5:49 PM

## 2017-02-28 NOTE — Progress Notes (Signed)
Follow up - Trauma and Critical Care  Patient Details:    Nathaniel Murphy is an 61 y.o. male.  Lines/tubes : Airway 7 mm (Active)  Secured at (cm) 23 cm 02/28/2017  8:37 AM  Measured From Lips 02/28/2017  8:37 AM  Secured Location Left 02/28/2017  8:37 AM  Secured By [employer] Tube Holder 02/28/2017  8:37 AM  Tube Holder Repositioned Yes 02/28/2017  8:37 AM  Cuff Pressure (cm H2O) 24 cm H2O 02/27/2017  3:54 PM  Site Condition Dry 02/28/2017  8:37 AM     PICC Double Lumen 02/27/17 PICC Right Basilic 48 cm 0 cm (Active)  Indication for Insertion or Continuance of Line Prolonged intravenous therapies 02/28/2017  8:00 AM  Exposed Catheter (cm) 0 cm 02/27/2017 10:00 AM  Site Assessment Clean;Dry;Intact 02/28/2017  8:00 AM  Lumen #1 Status Flushed;Blood return noted 02/28/2017  8:00 AM  Lumen #2 Status Infusing 02/28/2017  8:00 AM  Dressing Type Transparent 02/28/2017  8:00 AM  Dressing Status Clean;Dry;Intact 02/28/2017  8:00 AM  Dressing Intervention Dressing changed 02/27/2017  8:00 PM  Dressing Change Due 03/06/17 02/28/2017  8:00 AM     NG/OG Tube Orogastric Center mouth (Active)  Site Assessment Clean;Dry;Intact 02/28/2017  8:00 AM  Ongoing Placement Verification No change in cm markings or external length of tube from initial placement;No change in respiratory status;No acute changes, not attributed to clinical condition 02/28/2017  8:00 AM  Status Infusing tube feed 02/28/2017  8:00 AM  Intake (mL) 30 mL 02/27/2017  8:00 PM     External Urinary Catheter (Active)  Collection Container Standard drainage bag 02/28/2017  8:00 AM  Securement Method Tape 02/28/2017  8:00 AM  Intervention Equipment Changed 02/28/2017  8:00 AM  Output (mL) 300 mL 02/28/2017  5:00 AM    Microbiology/Sepsis markers: No results found for this or any previous visit.  Anti-infectives:  Anti-infectives    None      Best Practice/Protocols:  VTE Prophylaxis: Mechanical GI Prophylaxis: Proton  Pump Inhibitor Continous Sedation  Consults: Treatment Team:  Flo ShanksWolicki, Karol, MD Haddix, Gillie MannersKevin P, MD    Events:  Subjective:    Overnight Issues: Weaning this morning.  No distress  Objective:  Vital signs for last 24 hours: Temp:  [100 F (37.8 C)-103.2 F (39.6 C)] 100 F (37.8 C) (10/26 0800) Pulse Rate:  [29-99] 77 (10/26 0836) Resp:  [14-31] 18 (10/26 0836) BP: (88-166)/(51-102) 140/85 (10/26 0836) SpO2:  [92 %-100 %] 96 % (10/26 0836) FiO2 (%):  [30 %] 30 % (10/26 0837) Weight:  [67.4 kg (148 lb 9.4 oz)] 67.4 kg (148 lb 9.4 oz) (10/26 0350)  Hemodynamic parameters for last 24 hours:    Intake/Output from previous day: 10/25 0701 - 10/26 0700 In: 3237.1 [I.V.:1492.1; NG/GT:1640; IV Piggyback:105] Out: 3450 [Urine:3450]  Intake/Output this shift: Total I/O In: 342.7 [I.V.:62.7; NG/GT:70; IV Piggyback:210] Out: -   Vent settings for last 24 hours: Vent Mode: CPAP;PSV FiO2 (%):  [30 %] 30 % Set Rate:  [14 bmp] 14 bmp Vt Set:  [600 mL] 600 mL PEEP:  [5 cmH20] 5 cmH20 Pressure Support:  [5 cmH20] 5 cmH20 Plateau Pressure:  [11 cmH20-18 cmH20] 17 cmH20  Physical Exam:  General: alert and no respiratory distress Neuro: alert, oriented and nonfocal exam Resp: clear to auscultation bilaterally CVS: regular rate and rhythm, S1, S2 normal, no murmur, click, rub or gallop GI: soft, nontender, BS WNL, no r/g and has been tolerating tube feedings well. Extremities: no edema, no  erythema, pulses WNL  Results for orders placed or performed during the hospital encounter of 02/23/17 (from the past 24 hour(s))  Glucose, capillary     Status: Abnormal   Collection Time: 02/27/17 12:59 PM  Result Value Ref Range   Glucose-Capillary 116 (H) 65 - 99 mg/dL  CBC     Status: Abnormal   Collection Time: 02/27/17  2:08 PM  Result Value Ref Range   WBC 8.2 4.0 - 10.5 K/uL   RBC 2.47 (L) 4.22 - 5.81 MIL/uL   Hemoglobin 7.6 (L) 13.0 - 17.0 g/dL   HCT 16.1 (L) 09.6 - 04.5 %    MCV 93.5 78.0 - 100.0 fL   MCH 30.8 26.0 - 34.0 pg   MCHC 32.9 30.0 - 36.0 g/dL   RDW 40.9 81.1 - 91.4 %   Platelets 186 150 - 400 K/uL  Glucose, capillary     Status: Abnormal   Collection Time: 02/27/17  4:41 PM  Result Value Ref Range   Glucose-Capillary 109 (H) 65 - 99 mg/dL  Glucose, capillary     Status: Abnormal   Collection Time: 02/27/17  7:56 PM  Result Value Ref Range   Glucose-Capillary 112 (H) 65 - 99 mg/dL  Glucose, capillary     Status: Abnormal   Collection Time: 02/27/17 11:47 PM  Result Value Ref Range   Glucose-Capillary 134 (H) 65 - 99 mg/dL  Glucose, capillary     Status: None   Collection Time: 02/28/17  3:37 AM  Result Value Ref Range   Glucose-Capillary 74 65 - 99 mg/dL  CBC     Status: Abnormal   Collection Time: 02/28/17  5:34 AM  Result Value Ref Range   WBC 11.6 (H) 4.0 - 10.5 K/uL   RBC 2.66 (L) 4.22 - 5.81 MIL/uL   Hemoglobin 8.2 (L) 13.0 - 17.0 g/dL   HCT 78.2 (L) 95.6 - 21.3 %   MCV 94.0 78.0 - 100.0 fL   MCH 30.8 26.0 - 34.0 pg   MCHC 32.8 30.0 - 36.0 g/dL   RDW 08.6 57.8 - 46.9 %   Platelets 237 150 - 400 K/uL  Basic metabolic panel     Status: Abnormal   Collection Time: 02/28/17  5:34 AM  Result Value Ref Range   Sodium 137 135 - 145 mmol/L   Potassium 3.2 (L) 3.5 - 5.1 mmol/L   Chloride 102 101 - 111 mmol/L   CO2 26 22 - 32 mmol/L   Glucose, Bld 134 (H) 65 - 99 mg/dL   BUN 13 6 - 20 mg/dL   Creatinine, Ser 6.29 0.61 - 1.24 mg/dL   Calcium 8.7 (L) 8.9 - 10.3 mg/dL   GFR calc non Af Amer >60 >60 mL/min   GFR calc Af Amer >60 >60 mL/min   Anion gap 9 5 - 15  Glucose, capillary     Status: Abnormal   Collection Time: 02/28/17  7:51 AM  Result Value Ref Range   Glucose-Capillary 105 (H) 65 - 99 mg/dL     Assessment/Plan:   NEURO  Altered Mental Status:  agitation and sedation   Plan: Continue Precedex throughout extubation.  PULM  No known issues   Plan: CPM.  Wean to extubate this morning.  CARDIO  No specific issues    Plan: CPM  RENAL  Urine output and renal function are good.   Plan: CPM  GI  No specific problems.   Plan: CPM.  Hold tube feedings for extubation and pull OGT  with ETT  ID  No known infectious sources   Plan: CPM  HEME  Anemia acute blood loss anemia)   Plan: No plans to give blood.  ENDO No known issues   Plan: CPM  Global Issues  Patient doing well with weaning on the ventilator this morning.  Will hold tube feedings and try to extubate this morning.  Follwong commands    LOS: 5 days   Additional comments:I reviewed the patient's new clinical lab test results. cbc/bmet  Critical Care Total Time*: 30 Minutes  Samika Vetsch 02/28/2017  *Care during the described time interval was provided by me and/or other providers on the critical care team.  I have reviewed this patient's available data, including medical history, events of note, physical examination and test results as part of my evaluation.
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
## 2017-02-28 NOTE — Procedures (Signed)
Extubation Procedure Note  Patient Details:   Name: Nathaniel Murphy DOB: [birth year] MRN: [id number]   Airway Documentation:     Evaluation  O2 sats: stable throughout Complications: No apparent complications Patient did tolerate procedure well. Bilateral Breath Sounds: Rhonchi   Yes     Patient extubated to 4lnc. Vital signs stable at this time. No complications. RN at bedside. RT will continue to monitor.  Ave Filterdkins, Bosco Paparella Williams 02/28/2017, 11:49 AM

## 2017-02-28 NOTE — Progress Notes (Signed)
OT Cancellation Note  Patient Details Name: Nathaniel Murphy MRN: [id number] DOB: 08-28-1955   Cancelled Treatment:    Reason Eval/Treat Not Completed: Patient not medically ready. Pt on bedrest. Please update activity orders when appropriate for therapy. Thanks  [facility] LLC[facility],HILLARY  Xochilth Standish, OT/L  [phone] 02/28/2017 02/28/2017, 5:54 PM

## 2017-02-28 NOTE — Progress Notes (Signed)
Pharmacy Antibiotic Note  Gerri SporeYancey I Kardell is a 61 y.o. male admitted on 02/23/2017 after being found unresponsive, found to have SDH. Concern for pneumonia, now starting broad spectrum abx. SCr 0.7, low grade fever, mild leukocytosis.   Plan: -Vancomycin 1250 mg IV x1 then 750/8h -Zosyn 3.375 g IV q8h -Monitor renal fx, cultures, duration of therapy    Height: 5\' 11"  (180.3 cm) Weight: 148 lb 9.4 oz (67.4 kg) IBW/kg (Calculated) : 75.3  Temp (24hrs), Avg:101.1 F (38.4 C), Min:100 F (37.8 C), Max:103.2 F (39.6 C)   Recent Labs Lab 02/24/17 0532 02/25/17 0519 02/25/17 1635 02/26/17 0236 02/27/17 0353 02/27/17 1408 02/28/17 0534  WBC 10.8* 12.4*  --   --  9.5 8.2 11.6*  CREATININE 1.00 0.94 0.79 0.92 0.80  --  0.70    Estimated Creatinine Clearance: 94.8 mL/min (by C-G formula based on SCr of 0.7 mg/dL).    Allergies  Allergen Reactions  . Influenza Vaccines Diarrhea and Nausea And Vomiting    Arm swelling    Antimicrobials this admission: 10/26 vancomycin > 10/26 zosyn >   Dose adjustments this admission: N/A   Microbiology results: 10/26 resp cx:   Baldemar FridayMasters, Ohana Birdwell M 02/28/2017 1:35 PM

## 2017-03-01 DIAGNOSIS — S52552A Other extraarticular fracture of lower end of left radius, initial encounter for closed fracture: Secondary | ICD-10-CM

## 2017-03-01 DIAGNOSIS — W108XXA Fall (on) (from) other stairs and steps, initial encounter: Secondary | ICD-10-CM

## 2017-03-01 LAB — GLUCOSE, CAPILLARY
GLUCOSE-CAPILLARY: 112 mg/dL — AB (ref 65–99)
GLUCOSE-CAPILLARY: 115 mg/dL — AB (ref 65–99)
GLUCOSE-CAPILLARY: 81 mg/dL (ref 65–99)
GLUCOSE-CAPILLARY: 87 mg/dL (ref 65–99)
Glucose-Capillary: 88 mg/dL (ref 65–99)
Glucose-Capillary: 76 mg/dL (ref 65–99)

## 2017-03-01 LAB — VANCOMYCIN, TROUGH: Vancomycin Tr: 14 ug/mL — ABNORMAL LOW (ref 15–20)

## 2017-03-01 MED ORDER — PNEUMOCOCCAL VAC POLYVALENT 25 MCG/0.5ML IJ INJ
0.5000 mL | INJECTION | INTRAMUSCULAR | Status: AC
  Administered 2017-03-26: 0.5 mL via INTRAMUSCULAR
  Filled 2017-03-01 (×5): qty 0.5

## 2017-03-01 MED ORDER — CHLORHEXIDINE GLUCONATE CLOTH 2 % EX PADS
6.0000 | MEDICATED_PAD | Freq: Every day | CUTANEOUS | Status: DC
  Administered 2017-03-02 – 2017-03-14 (×14): 6 via TOPICAL

## 2017-03-01 MED ORDER — CHLORHEXIDINE GLUCONATE CLOTH 2 % EX PADS
6.0000 | MEDICATED_PAD | Freq: Every day | CUTANEOUS | Status: DC
  Administered 2017-03-01 (×2): 6 via TOPICAL

## 2017-03-01 NOTE — Progress Notes (Signed)
Pharmacy Antibiotic Note Nathaniel Murphy is a 61 y.o. male admitted on 02/23/2017 that is currently on day 3 of Zosyn and vancomycin for continued PNA.  Vancomycin trough of 14 this evening is slightly below desired range of 15-20.   Plan: 1. Continue vancomycin 750 mg IV every 8 hours as expect some accumulation across continued dosing intervals  2. Remains on Zosyn 3.375 grams IV every 8 hours (infused over 4 hours)  Height: 5\' 11"  (180.3 cm) Weight: 140 lb 6.9 oz (63.7 kg) IBW/kg (Calculated) : 75.3  Temp (24hrs), Avg:99.2 F (37.3 C), Min:98.9 F (37.2 C), Max:99.4 F (37.4 C)   Recent Labs Lab 02/24/17 0532 02/25/17 0519 02/25/17 1635 02/26/17 0236 02/27/17 0353 02/27/17 1408 02/28/17 0534 03/01/17 2037  WBC 10.8* 12.4*  --   --  9.5 8.2 11.6*  --   CREATININE 1.00 0.94 0.79 0.92 0.80  --  0.70  --   VANCOTROUGH  --   --   --   --   --   --   --  14*    Estimated Creatinine Clearance: 89.6 mL/min (by C-G formula based on SCr of 0.7 mg/dL).    Allergies  Allergen Reactions  . Influenza Vaccines Diarrhea and Nausea And Vomiting    Arm swelling    Antimicrobials this admission: 10/26 vancomycin > 10/26 zosyn >  Microbiology results: 10/26 sputum: px  Thank you for allowing pharmacy to be a part of this patient's care.  Pollyann SamplesAndy Avamarie Crossley, PharmD, BCPS 03/01/2017, 9:38 PM

## 2017-03-01 NOTE — Progress Notes (Signed)
Inpatient Rehabilitation  Per SLP request, patient was screened by Fae PippinMelissa Channelle Bottger for appropriateness for an Inpatient Acute Rehab consult.  At this time we will follow along for PT/OT recommendations.  Charlane FerrettiMelissa Desean Heemstra, M.A., CCC/SLP Admission Coordinator  [facility]  Cell [phone]
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
## 2017-03-01 NOTE — Progress Notes (Signed)
Cortrak Tube Team Note:  Consult received to place a Cortrak feeding tube.   A 10 F Cortrak tube was placed in the R nare and secured with a nasal bridle at 85 cm. Per the Cortrak monitor reading the tube tip is gastric antrum   No x-ray is required. RN may begin using tube.   If the tube becomes dislodged please keep the tube and contact the Cortrak team at [email or web address] (password [email or web address]) for replacement.  If after hours and replacement cannot be delayed, place a NG tube and confirm placement with an abdominal x-ray.
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
## 2017-03-02 LAB — GLUCOSE, CAPILLARY
GLUCOSE-CAPILLARY: 114 mg/dL — AB (ref 65–99)
GLUCOSE-CAPILLARY: 116 mg/dL — AB (ref 65–99)
Glucose-Capillary: 101 mg/dL — ABNORMAL HIGH (ref 65–99)
Glucose-Capillary: 116 mg/dL — ABNORMAL HIGH (ref 65–99)
Glucose-Capillary: 117 mg/dL — ABNORMAL HIGH (ref 65–99)
Glucose-Capillary: 123 mg/dL — ABNORMAL HIGH (ref 65–99)
Glucose-Capillary: 129 mg/dL — ABNORMAL HIGH (ref 65–99)

## 2017-03-02 MED ORDER — TRAMADOL HCL 50 MG PO TABS
50.0000 mg | ORAL_TABLET | Freq: Four times a day (QID) | ORAL | Status: DC | PRN
  Administered 2017-03-02 – 2017-03-04 (×3): 50 mg
  Filled 2017-03-02 (×6): qty 1
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
## 2017-03-02 NOTE — Progress Notes (Signed)
Patient ID: Nathaniel Murphy I Albus, male   DOB: 1955/11/15, 61 y.o.   MRN: [id number] Subjective:  The patient is alert. He mouths answers to simple questions. He is moving all 4 extremities.  Objective: Vital signs in last 24 hours: Temp:  [98.5 F (36.9 C)-99.4 F (37.4 C)] 99 F (37.2 C) (10/28 0400) Pulse Rate:  [39-106] 95 (10/28 0500) Resp:  [15-31] 21 (10/28 0500) BP: (139-182)/(78-110) 182/110 (10/28 0500) SpO2:  [86 %-100 %] 89 % (10/28 0500) Weight:  [64 kg (141 lb 1.5 oz)] 64 kg (141 lb 1.5 oz) (10/28 0416)  Intake/Output from previous day: 10/27 0701 - 10/28 0700 In: 3003.4 [I.V.:1245.3; NG/GT:848.2; IV Piggyback:910] Out: 1750 [Urine:1750] Intake/Output this shift: No intake/output data recorded.  Physical exam the patient is alert and attentive. He follows commands. He is moving all 4 extremities.  Lab Results:  Recent Labs  02/27/17 1408 02/28/17 0534  WBC 8.2 11.6*  HGB 7.6* 8.2*  HCT 23.1* 25.0*  PLT 186 237   BMET  Recent Labs  02/28/17 0534  NA 137  K 3.2*  CL 102  CO2 26  GLUCOSE 134*  BUN 13  CREATININE 0.70  CALCIUM 8.7*    Studies/Results: No results found.  Assessment/Plan: Status post craniotomy for subdural hematoma: The patient is improving clinically.  LOS: 7 days     Keyah Blizard D 03/02/2017, 8:14 AM

## 2017-03-02 NOTE — Progress Notes (Signed)
   03/02/17 0924  SLP Visit Information  SLP Received On 03/01/17  Reason Eval/Treat Not Completed Patient declined, no reason specified (due to pain)

## 2017-03-02 NOTE — Progress Notes (Signed)
Speech therapist paged to see if she is available to perform swallow evaluation, as patient is currently asking if he can have grits. Awaiting call back.

## 2017-03-02 NOTE — Progress Notes (Signed)
SLP Cancellation Note  Patient Details Name: Nathaniel Murphy I Mormile MRN: [id number] DOB: 07/25/1955   Cancelled treatment:       Reason Eval/Treat Not Completed: Patient declined due to pain.  Nurse aware.  ST will follow back up today if schedule allows or next date for re assessment of swallowing.     Dimas AguasMelissa Daryl Beehler, MA, CCC-SLP Acute Rehab SLP [phone]  Fleet ContrasMelissa N Commodore Bellew 03/02/2017, 9:24 AM
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
## 2017-03-02 NOTE — Progress Notes (Signed)
Inpatient Rehabilitation  Following up after PT Eval and note that they are also recommending IP Rehab for post acute rehab.  MD please order if in agreement with plan.    Nathaniel FerrettiMelissa Charline Murphy, M.A., CCC/SLP Admission Coordinator  [facility]  Cell [phone]

## 2017-03-02 NOTE — Plan of Care (Signed)
Problem: Safety: Goal: Ability to remain free from injury will improve Outcome: Progressing Bed in low position, bed alarm on, non skid socks on, hourly rounds done.   

## 2017-03-03 DIAGNOSIS — E876 Hypokalemia: Secondary | ICD-10-CM

## 2017-03-03 DIAGNOSIS — G894 Chronic pain syndrome: Secondary | ICD-10-CM

## 2017-03-03 DIAGNOSIS — I10 Essential (primary) hypertension: Secondary | ICD-10-CM

## 2017-03-03 DIAGNOSIS — Z9889 Other specified postprocedural states: Secondary | ICD-10-CM

## 2017-03-03 DIAGNOSIS — D62 Acute posthemorrhagic anemia: Secondary | ICD-10-CM

## 2017-03-03 DIAGNOSIS — W108XXD Fall (on) (from) other stairs and steps, subsequent encounter: Secondary | ICD-10-CM

## 2017-03-03 DIAGNOSIS — D72829 Elevated white blood cell count, unspecified: Secondary | ICD-10-CM

## 2017-03-03 DIAGNOSIS — R0682 Tachypnea, not elsewhere classified: Secondary | ICD-10-CM

## 2017-03-03 DIAGNOSIS — F411 Generalized anxiety disorder: Secondary | ICD-10-CM

## 2017-03-03 LAB — GLUCOSE, CAPILLARY
GLUCOSE-CAPILLARY: 112 mg/dL — AB (ref 65–99)
GLUCOSE-CAPILLARY: 115 mg/dL — AB (ref 65–99)
Glucose-Capillary: 113 mg/dL — ABNORMAL HIGH (ref 65–99)
Glucose-Capillary: 107 mg/dL — ABNORMAL HIGH (ref 65–99)
Glucose-Capillary: 117 mg/dL — ABNORMAL HIGH (ref 65–99)
Glucose-Capillary: 105 mg/dL — ABNORMAL HIGH (ref 65–99)

## 2017-03-03 LAB — CULTURE, RESPIRATORY W GRAM STAIN: Culture: NORMAL

## 2017-03-03 LAB — CULTURE, RESPIRATORY: SPECIAL REQUESTS: NORMAL

## 2017-03-03 MED ORDER — QUETIAPINE FUMARATE 50 MG PO TABS
50.0000 mg | ORAL_TABLET | Freq: Two times a day (BID) | ORAL | Status: DC
  Administered 2017-03-03: 50 mg
  Administered 2017-03-03: 25 mg
  Administered 2017-03-04 – 2017-03-05 (×2): 50 mg
  Filled 2017-03-03 (×3): qty 1

## 2017-03-03 MED ORDER — ENOXAPARIN SODIUM 40 MG/0.4ML ~~LOC~~ SOLN
40.0000 mg | SUBCUTANEOUS | Status: DC
  Administered 2017-03-03 – 2017-03-26 (×23): 40 mg via SUBCUTANEOUS
  Filled 2017-03-03 (×24): qty 0.4

## 2017-03-03 MED ORDER — METOPROLOL TARTRATE 25 MG/10 ML ORAL SUSPENSION
50.0000 mg | Freq: Two times a day (BID) | ORAL | Status: DC
  Administered 2017-03-03 – 2017-03-05 (×3): 50 mg
  Filled 2017-03-03 (×6): qty 20

## 2017-03-03 NOTE — Clinical Social Work Note (Signed)
Clinical Social Worker following patient and family for support.  Due patient intermittent orientation, SBIRT assessment remains inappropriate for completion at this time.  Patient is also likely not a reliable source for law enforcement questioning.  CSW remains available if patient mental status clears and patient appropriate.  Macario GoldsJesse Rafia Shedden, [place] [phone]
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
## 2017-03-03 NOTE — Progress Notes (Signed)
Physical Therapy Treatment Patient Details Name: Nathaniel Murphy MRN: [id number] DOB: [birth year] Today's Date: 03/03/2017    History of Present Illness pt is a [age] ylo male with pmh significant for alcoholism and seizures, admitted after a fall down the steps in which imaging shows he suffered a C2 body and C5 facet fxs along with multiple facial fx's including R orbit, frontal bone, zygoma, maxillary sinusand and SDH. Extra-articular distal L radius fracture with small ulnar styloid fx.  Pt s/p emergent crani for evacuation of hematoma, sedated and intubated. Extubated 10/26.    PT Comments    Pt limited by fatigue, and increased BP during therapy to EOB activity.  He requires two person assist to get EOB and stand EOB and was unable to take sideways steps. He generally presents like a Rancho VI, but further assessment is needed as he becomes more responsive.  Pt will continue to follow acutely.   Follow Up Recommendations  CIR;Supervision for mobility/OOB     Equipment Recommendations  None recommended by PT    Recommendations for Other Services Rehab consult     Precautions / Restrictions Precautions Precautions: Fall Precaution Comments: watch BP Required Braces or Orthoses: Other Brace/Splint;Cervical Brace Cervical Brace: At all times;Hard collar Other Brace/Splint: L volar wrist splint Restrictions Weight Bearing Restrictions: Yes LUE Weight Bearing: Weight bear through elbow only    Mobility  Bed Mobility Overal bed mobility: Needs Assistance Bed Mobility: Rolling;Sidelying to Sit;Sit to Sidelying Rolling: Max assist Sidelying to sit: Max assist     Sit to sidelying: Max assist;+2 for physical assistance General bed mobility comments: Max assist at trunk and legs, pt attempting to help with arms (unfortunately left greater than right at times with cues to not push or pull with this side).   Transfers Overall transfer level: Needs assistance   Transfers: Sit  to/from Stand Sit to Stand: Mod assist;+2 physical assistance;+2 safety/equipment         General transfer comment: Unable to weight shift totake a step; pt reaching appropirtely to help boost self to standing  Ambulation/Gait             General Gait Details: unable at this time.           Balance Overall balance assessment: Needs assistance Sitting-balance support: Single extremity supported;No upper extremity supported;Feet supported Sitting balance-Leahy Scale: Poor Sitting balance - Comments: R bias Postural control: Right lateral lean Standing balance support: Single extremity supported;No upper extremity supported Standing balance-Leahy Scale: Poor Standing balance comment: two person heavy mod assist with knees blocked in standing.                              Cognition Arousal/Alertness: Lethargic (on sedating meds) Behavior During Therapy: Flat affect Overall Cognitive Status: Impaired/Different from baseline Area of Impairment: Orientation;Attention;Memory;Following commands;Safety/judgement;Awareness;Problem solving               Rancho Levels of Cognitive Functioning Rancho Los Amigos Scales of Cognitive Functioning: Confused/appropriate (may be higher or lower, difficult to tell due to sedative ) Orientation Level: Disoriented to;Place;Time Current Attention Level: Sustained Memory: Decreased short-term memory;Decreased recall of precautions Following Commands: Follows one step commands with increased time Safety/Judgement: Decreased awareness of safety;Decreased awareness of deficits Awareness: Intellectual Problem Solving: Slow processing;Decreased initiation;Difficulty sequencing;Requires verbal cues;Requires tactile cues General Comments: Slow to respond, sometimes not at all or incoherant, sedated today,          General  Comments General comments (skin integrity, edema, etc.): Limited to EOB activity as pt's BP was 189/100s EOB.        Pertinent Vitals/Pain Pain Assessment: Faces Faces Pain Scale: Hurts little more Pain Location: unable ot determine Pain Descriptors / Indicators: Grimacing Pain Intervention(s): Limited activity within patient's tolerance;Monitored during session;Repositioned    Home Living Family/patient expects to be discharged to:: Inpatient rehab                    Prior Function Level of Independence: Independent      Comments: working, driving,    PT Goals (current goals can now be found in the care plan section) Acute Rehab PT Goals Patient Stated Goal: none stated Progress towards PT goals: Progressing toward goals    Frequency    Min 4X/week      PT Plan Current plan remains appropriate;Frequency needs to be updated    Co-evaluation PT/OT/SLP Co-Evaluation/Treatment: Yes Reason for Co-Treatment: Complexity of the patient's impairments (multi-system involvement);Necessary to address cognition/behavior during functional activity;For patient/therapist safety;To address functional/ADL transfers PT goals addressed during session: Mobility/safety with mobility;Balance;Strengthening/ROM OT goals addressed during session: ADL's and self-care      AM-PAC PT "6 Clicks" Daily Activity  Outcome Measure  Difficulty turning over in bed (including adjusting bedclothes, sheets and blankets)?: Unable Difficulty moving from lying on back to sitting on the side of the bed? : Unable Difficulty sitting down on and standing up from a chair with arms (e.g., wheelchair, bedside commode, etc,.)?: Unable Help needed moving to and from a bed to chair (including a wheelchair)?: A Lot Help needed walking in hospital room?: Total Help needed climbing 3-5 steps with a railing? : Total 6 Click Score: 7    End of Session Equipment Utilized During Treatment: Gait belt;Cervical collar Activity Tolerance: Patient limited by fatigue;Patient limited by lethargy;Patient limited by pain Patient  left: in bed;with call bell/phone within reach;with bed alarm set;with nursing/sitter in room Nurse Communication: Mobility status PT Visit Diagnosis: Unsteadiness on feet (R26.81);Other symptoms and signs involving the nervous system (R29.898);Muscle weakness (generalized) (M62.81);Hemiplegia and hemiparesis Hemiplegia - Right/Left: Right Hemiplegia - dominant/non-dominant:  (unsure of hand dominance) Hemiplegia - caused by: Unspecified (traumatic SDH)     Time: 4098-1191 PT Time Calculation (min) (ACUTE ONLY): 28 min  Charges:  $Therapeutic Activity: 8-22 mins          Warda Mcqueary B. Myha Arizpe, PT, DPT [phone]            03/03/2017, 3:51 PM

## 2017-03-03 NOTE — Progress Notes (Signed)
Occupational Therapy Evaluation Patient Details Name: Nathaniel Murphy MRN: [id number] DOB: Jul 24, 1955 Today's Date: 03/03/2017    History of Present Illness pt is a 62 ylo male with pmh significant for alcoholism and seizures, admitted after a fall down the steps in which imaging shows he suffered a C2 body and C5 facet fxs along with multiple facial fx's including R orbit, frontal bone, zygoma, maxillary sinusand and SDH. Extra-articular distal L radius fracture with small ulnar styloid fx.  Pt s/p emergent crani for evacuation of hematoma, sedated and intubated. Extubated 10/26.   Clinical Impression   PTA, pt apparently lived alone and worked/drove. Evaluation limited due to lethargy (pt on sedating meds) and BP sitting (189/109). Pt appears confused with delayed initiation. Pt aware he is in the hospital [facility]) and aware he fell down the stairs. Pt required Max A +2 with sit - stand and unable to take steps at this time ( most likley related to meds). Total A with ADL at this time. Pt more consistent with Rancho level VI (confused/appropirate) during this session. Recommend CIR for rehab to maximize functional level of independence. Will follow acutely to address established goals and facilitate safe DC to next venue of care.     Follow Up Recommendations  CIR;Supervision/Assistance - 24 hour    Equipment Recommendations  Other (comment) (TBA)    Recommendations for Other Services Rehab consult     Precautions / Restrictions Precautions Precautions: Fall Precaution Comments: watch BP Required Braces or Orthoses: Other Brace/Splint;Cervical Brace Cervical Brace: At all times;Hard collar Other Brace/Splint: L volar wrist splint Restrictions Weight Bearing Restrictions: Yes LUE Weight Bearing: Weight bear through elbow only      Mobility Bed Mobility Overal bed mobility: Needs Assistance Bed Mobility: Rolling;Sidelying to Sit;Sit to Sidelying Rolling: Max assist Sidelying  to sit: Max assist     Sit to sidelying: Max assist;+2 for physical assistance    Transfers Overall transfer level: Needs assistance   Transfers: Sit to/from Stand Sit to Stand: Mod assist;+2 physical assistance;+2 safety/equipment         General transfer comment: Unable to take a step; pt reaching appropirtely to help boost self to standing    Balance Overall balance assessment: Needs assistance   Sitting balance-Leahy Scale: Poor Sitting balance - Comments: R bias     Standing balance-Leahy Scale: Poor                             ADL either performed or assessed with clinical judgement   ADL Overall ADL's : Needs assistance/impaired Eating/Feeding: NPO   Grooming: Maximal assistance Grooming Details (indicate cue type and reason): able to wipe mouth/eyes with physical assistacne Upper Body Bathing: Maximal assistance;Bed level   Lower Body Bathing: Total assistance;Bed level   Upper Body Dressing : Total assistance   Lower Body Dressing: Total assistance               Functional mobility during ADLs: +2 for physical assistance;Maximal assistance General ADL Comments: Difficulty maintaining level of arousal to participate with ADL (? related to meds)     Vision   Additional Comments: will further assess     Perception Perception Comments: will assess   Praxis Praxis Praxis-Other Comments: dealy in processing; motor planning appears intact; will further asess    Pertinent Vitals/Pain Pain Assessment: Faces Faces Pain Scale: Hurts little more Pain Location: unable ot determine Pain Descriptors / Indicators: Grimacing Pain Intervention(s): Limited  activity within patient's tolerance     Hand Dominance  (unsure)   Extremity/Trunk Assessment Upper Extremity Assessment Upper Extremity Assessment: RUE deficits/detail;LUE deficits/detail RUE Deficits / Details: unable to reach mouth with hand to wipe mouth; difficulty manipulating and  maintaining grasp on cloth; will further assess when more alert RUE Coordination: decreased fine motor;decreased gross motor LUE Deficits / Details: L wrist plaster splint/ace wrap; attempting to bear weight through LUE; ableot initiate movement with LUE; generalized weakness; will further assess LUE Coordination: decreased fine motor;decreased gross motor   Lower Extremity Assessment Lower Extremity Assessment: Defer to PT evaluation   Cervical / Trunk Assessment Cervical / Trunk Assessment: Kyphotic   Communication Communication Communication: Expressive difficulties   Cognition Arousal/Alertness: Lethargic (on sedating meds) Behavior During Therapy: Flat affect Overall Cognitive Status: Impaired/Different from baseline Area of Impairment: Orientation;Attention;Memory;Following commands;Safety/judgement;Awareness;Problem solving               Rancho Levels of Cognitive Functioning Rancho Los Amigos Scales of Cognitive Functioning: Confused/appropriate Orientation Level: Disoriented to;Place;Time Current Attention Level: Sustained Memory: Decreased short-term memory;Decreased recall of precautions Following Commands: Follows one step commands with increased time Safety/Judgement: Decreased awareness of safety;Decreased awareness of deficits Awareness: Intellectual Problem Solving: Slow processing;Decreased initiation;Difficulty sequencing;Requires verbal cues;Requires tactile cues     General Comments   At end of session pt said "thank you" Dr Jena Gauss note states "thermosplastic splint" - unsure when this needs to be completed    Exercises     Shoulder Instructions      Home Living Family/patient expects to be discharged to:: Inpatient rehab                                        Prior Functioning/Environment Level of Independence: Independent        Comments: working, driving,         OT Problem List: Decreased strength;Decreased range of  motion;Decreased activity tolerance;Impaired balance (sitting and/or standing);Decreased coordination;Decreased cognition;Decreased safety awareness;Decreased knowledge of use of DME or AE;Decreased knowledge of precautions;Cardiopulmonary status limiting activity;Impaired UE functional use;Pain      OT Treatment/Interventions: Self-care/ADL training;Therapeutic exercise;Neuromuscular education;DME and/or AE instruction;Therapeutic activities;Cognitive remediation/compensation;Visual/perceptual remediation/compensation;Patient/family education;Balance training    OT Goals(Current goals can be found in the care plan section) Acute Rehab OT Goals Patient Stated Goal: none stated OT Goal Formulation: Patient unable to participate in goal setting Time For Goal Achievement: 03/17/17 Potential to Achieve Goals: Good  OT Frequency: Min 3X/week   Barriers to D/C:            Co-evaluation PT/OT/SLP Co-Evaluation/Treatment: Yes Reason for Co-Treatment: Complexity of the patient's impairments (multi-system involvement);Necessary to address cognition/behavior during functional activity;For patient/therapist safety;To address functional/ADL transfers   OT goals addressed during session: ADL's and self-care      AM-PAC PT "6 Clicks" Daily Activity     Outcome Measure Help from another person eating meals?: Total Help from another person taking care of personal grooming?: A Lot Help from another person toileting, which includes using toliet, bedpan, or urinal?: Total Help from another person bathing (including washing, rinsing, drying)?: Total Help from another person to put on and taking off regular upper body clothing?: Total Help from another person to put on and taking off regular lower body clothing?: Total 6 Click Score: 7   End of Session Equipment Utilized During Treatment: Rolling walker;Oxygen (3L) Nurse Communication: Mobility status;Weight bearing status  Activity  Tolerance: Patient  limited by lethargy Patient left: in bed;with call bell/phone within reach;with bed alarm set;with SCD's reapplied  OT Visit Diagnosis: Other abnormalities of gait and mobility (R26.89);Muscle weakness (generalized) (M62.81);Other symptoms and signs involving cognitive function                Time: 1329-1359 OT Time Calculation (min): 30 min Charges:  OT General Charges $OT Visit: 1 Visit OT Evaluation $OT Eval Moderate Complexity: 1 Mod G-Codes:     [facility] Dayvon Dax, OT/L  [phone] 03/03/2017  Thamar Holik,HILLARY  Ellanie Oppedisano, OT/L  [phone] 03/03/2017 03/03/2017, 2:17 PM

## 2017-03-03 NOTE — Progress Notes (Signed)
Nutrition Follow-up  DOCUMENTATION CODES:   Not applicable  INTERVENTION:  - Continue Vital AF 1.2 @ 70 ml/hr (1680 mL daily) Providing: 2016 kcal, 126 grams protein, and 1362 ml free water.  - Monitor for diet advancement/changes in status  NUTRITION DIAGNOSIS:   Inadequate oral intake related to inability to eat as evidenced by NPO status.  Ongoing   GOAL:   Patient will meet greater than or equal to 90% of their needs  Met via TF  MONITOR:   Diet advancement, Weight trends, TF tolerance, I & O's  ASSESSMENT:   Pt with PMH of ETOH abuse admitted s/p fall with SDH, C2 fracture, s/p crani 10/21.   Discussed pt with RN Pt tolerating TF Pt extubated 10/26 Pt s/p SLP evaluation 10/28 recommend continuation of current plan. SLP continues to follow. Pt continues NPO receiving Vital AF 1.2 @ 70 ml/hr Per chart, pt is net positive 11.7 L since admission Continued suspicion for malnutrition however pt unable to provide nutrition history at time of visit.   Labs reviewed; CBG 76-129 Medications reviewed; Colace, Folvite, sliding scale insulin, multivitamin, Protonix, thiamine  Diet Order:  Diet NPO time specified  EDUCATION NEEDS:   No education needs identified at this time  Skin:  Skin Assessment: Reviewed, no issues  Last BM:  03/03/17 200 ml via rectal pouch  Height:   Ht Readings from Last 1 Encounters:  02/23/17 '5\' 11"'$  (1.803 m)    Weight:   Wt Readings from Last 1 Encounters:  03/02/17 141 lb 1.5 oz (64 kg)    Ideal Body Weight:  78.1 kg  BMI:  Body mass index is 19.68 kg/m.  Estimated Nutritional Needs:   Kcal:  5498-2641  Protein:  90-115 grams  Fluid:  > 2 L/day  Parks Ranger, MS, RDN, LDN 03/03/2017 1:48 PM
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
## 2017-03-04 LAB — BASIC METABOLIC PANEL
Anion gap: 6 (ref 5–15)
BUN: 8 mg/dL (ref 6–20)
CHLORIDE: 101 mmol/L (ref 101–111)
CO2: 29 mmol/L (ref 22–32)
CREATININE: 0.71 mg/dL (ref 0.61–1.24)
Calcium: 8.9 mg/dL (ref 8.9–10.3)
Glucose, Bld: 118 mg/dL — ABNORMAL HIGH (ref 65–99)
Potassium: 3.4 mmol/L — ABNORMAL LOW (ref 3.5–5.1)
SODIUM: 136 mmol/L (ref 135–145)

## 2017-03-04 LAB — GLUCOSE, CAPILLARY
GLUCOSE-CAPILLARY: 116 mg/dL — AB (ref 65–99)
GLUCOSE-CAPILLARY: 96 mg/dL (ref 65–99)
GLUCOSE-CAPILLARY: 118 mg/dL — AB (ref 65–99)
Glucose-Capillary: 114 mg/dL — ABNORMAL HIGH (ref 65–99)
Glucose-Capillary: 96 mg/dL (ref 65–99)
Glucose-Capillary: 121 mg/dL — ABNORMAL HIGH (ref 65–99)
Glucose-Capillary: 92 mg/dL (ref 65–99)

## 2017-03-04 MED ORDER — HYDROCHLOROTHIAZIDE 10 MG/ML ORAL SUSPENSION: 12.5000 mg | Freq: Every day | ORAL | Status: DC

## 2017-03-04 MED ORDER — POTASSIUM CHLORIDE 20 MEQ/15ML (10%) PO SOLN
40.0000 meq | Freq: Once | ORAL | Status: AC
Start: 2017-03-04 — End: 2017-03-04
  Administered 2017-03-04: 40 meq

## 2017-03-04 MED ORDER — HYDROCHLOROTHIAZIDE 10 MG/ML ORAL SUSPENSION
12.5000 mg | Freq: Every day | ORAL | Status: DC
  Filled 2017-03-04 (×5): qty 1.88

## 2017-03-04 MED ORDER — POTASSIUM CHLORIDE 20 MEQ/15ML (10%) PO SOLN
ORAL | Status: AC
  Filled 2017-03-04: qty 30

## 2017-03-04 MED ORDER — HYDROCHLOROTHIAZIDE 12.5 MG PO CAPS: 12.5000 mg | ORAL_CAPSULE | Freq: Every day | ORAL | Status: DC
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
## 2017-03-04 NOTE — Plan of Care (Signed)
Problem: Fluid Volume: Goal: Ability to maintain a balanced intake and output will improve Outcome: Progressing Pt on tube feedings  Problem: Nutrition: Goal: Adequate nutrition will be maintained Outcome: Progressing Pt on tube feedings  Problem: Bowel/Gastric: Goal: Will not experience complications related to bowel motility Outcome: Progressing Pt with very loose stools

## 2017-03-04 NOTE — Progress Notes (Signed)
Pharmacy Antibiotic Note Nathaniel Murphy is a 61 y.o. male admitted on 02/23/2017. He continues on D#5 of vancomycin and zosyn. Pt is afebrile.   Plan: Continue vanc/zosyn as ordered F/u LOT and ability to de-escalate  Height: 5\' 11"  (180.3 cm) Weight: 143 lb (64.9 kg) IBW/kg (Calculated) : 75.3  Temp (24hrs), Avg:98.8 F (37.1 C), Min:98.2 F (36.8 C), Max:99.5 F (37.5 C)   Recent Labs Lab 02/25/17 1635 02/26/17 0236 02/27/17 0353 02/27/17 1408 02/28/17 0534 03/01/17 2037 03/04/17 0502  WBC  --   --  9.5 8.2 11.6*  --   --   CREATININE 0.79 0.92 0.80  --  0.70  --  0.71  VANCOTROUGH  --   --   --   --   --  14*  --     Estimated Creatinine Clearance: 91.3 mL/min (by C-G formula based on SCr of 0.71 mg/dL).    Allergies  Allergen Reactions  . Influenza Vaccines Diarrhea and Nausea And Vomiting    Arm swelling    Antimicrobials this admission: 10/26 vancomycin > 10/26 zosyn >  Microbiology results: 10/26 sputum: NEG  Thank you for allowing pharmacy to be a part of this patient's care.  Lysle Pearlachel Danyla Wattley, PharmD, BCPS 03/04/2017, 1:21 PM
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
## 2017-03-04 NOTE — Progress Notes (Addendum)
Inpatient Rehabilitation  Left booklets at bedside for IP Rehab program.  Called daughter, Nathaniel Murphy to discuss team's recommendation and anticipated outcomes.  Plan to update team after I speak with daughter.  Call if questions.   Update: Spoke with daughter Nathaniel Murphy via phone to discuss team's recommendation for IP Rehab as well as family care responsibility following a short IP Rehab stay.  She stated that she will look at booklets and discuss it with the family, but there will not likely be anyone to take care of Nathaniel Murphy and he will need SNF.  I offered for her to call me back if questions, or if caregiver support should change.   Charlane FerrettiMelissa Lanai Conlee, M.A., CCC/SLP Admission Coordinator  [facility]  Cell [phone]
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
## 2017-03-04 NOTE — Progress Notes (Signed)
Physical Therapy Treatment Patient Details Name: Nathaniel Murphy MRN: [id number] DOB: 1955/08/30 Today's Date: 03/04/2017    History of Present Illness pt is a [age] ylo male with pmh significant for alcoholism and seizures, admitted after a fall down the steps in which imaging shows he suffered a C2 body and C5 facet fxs along with multiple facial fx's including R orbit, frontal bone, zygoma, maxillary sinusand and SDH. Extra-articular distal L radius fracture with small ulnar styloid fx.  Pt s/p emergent crani for evacuation of hematoma, sedated and intubated. Extubated 10/26.    PT Comments    Pt falling in and out of sleep upon PT arrival with nursing present in room. Required lots of verbal and tactile cueing to respond to one step commands w/ delayed response time. Verbalized name and DOB in low faint voice tone, but unsure of current location. Continued R lateral lean when sitting both at EOB and in chair. Positioned in chair w/ pillow below R Elbow and pillow behind R shoulder to support his lean. Pt tolerated treatment without abnormal BP rise and no verbalization of pain. Left in chair w/ call bell in reach and nursing present.   Follow Up Recommendations  CIR;Supervision for mobility/OOB     Equipment Recommendations  None recommended by PT    Recommendations for Other Services Rehab consult     Precautions / Restrictions Precautions Precautions: Fall Precaution Comments: watch BP and multiple lines/tubes Required Braces or Orthoses: Other Brace/Splint;Cervical Brace Cervical Brace: At all times;Hard collar Other Brace/Splint: L volar wrist splint Restrictions Weight Bearing Restrictions: Yes LUE Weight Bearing: Weight bear through elbow only    Mobility  Bed Mobility Overal bed mobility: Needs Assistance Bed Mobility: Rolling;Sidelying to Sit Rolling: Max assist Sidelying to sit: Max assist;+2 for physical assistance       General bed mobility comments: Max  assist +2 with support at shoulders/trunk and legs for sidelying to sit transfer. Required verbal cues to push into bed with uninvolved elbow to help propel self up into sitting.  Transfers Overall transfer level: Needs assistance   Transfers: Sit to/from Stand;Stand Pivot Transfers Sit to Stand: Mod assist;+2 physical assistance;+2 safety/equipment Stand pivot transfers: Mod assist;+2 physical assistance;+2 safety/equipment       General transfer comment: 2+ mod assist for saftey and physical assistance to get into standing. Pt was unable to move L LE forward in standing, and required max physical assist to move L Leg forward and create room for body to pivot into chair.  Ambulation/Gait             General Gait Details: unable at this time.    Stairs            Wheelchair Mobility    Modified Rankin (Stroke Patients Only)       Balance Overall balance assessment: Needs assistance Sitting-balance support: Single extremity supported;No upper extremity supported;Feet supported Sitting balance-Leahy Scale: Poor Sitting balance - Comments: Required Total A from backside when sitting at EOB w/o feet supported on ground and R bias present. Once feet supported, Mod-Total A required to balance at EOB, with increased assist required as time progressed. Postural control: Right lateral lean Standing balance support: Single extremity supported;No upper extremity supported Standing balance-Leahy Scale: Poor Standing balance comment: two person heavy mod assist w/ knees blocked into standing, and throughout pivot transfer into sitting in chair.  Cognition Arousal/Alertness: Lethargic Behavior During Therapy: Flat affect Overall Cognitive Status: Impaired/Different from baseline Area of Impairment: Orientation;Attention;Memory;Following commands;Safety/judgement;Awareness;Problem solving               Rancho Levels of Cognitive  Functioning Rancho Los Amigos Scales of Cognitive Functioning: Confused/appropriate Orientation Level: Disoriented to;Place;Time Current Attention Level: Sustained Memory: Decreased short-term memory;Decreased recall of precautions Following Commands: Follows one step commands with increased time Safety/Judgement: Decreased awareness of safety;Decreased awareness of deficits Awareness: Intellectual Problem Solving: Slow processing;Decreased initiation;Difficulty sequencing;Requires verbal cues;Requires tactile cues General Comments: Continued need for engagement and reorientation to maintain current alertness level. Still slow to respond to commands, and requires multiple cues at times to execute a task.      Exercises      General Comments General comments (skin integrity, edema, etc.): BP checked in bed and at EOB before transfer to chair. BP at EOB 161/116, and 179/103 when sitting in chair after transfer.      Pertinent Vitals/Pain Pain Assessment: Faces Faces Pain Scale: No hurt    Home Living                      Prior Function            PT Goals (current goals can now be found in the care plan section) Acute Rehab PT Goals Patient Stated Goal: none stated Progress towards PT goals: Progressing toward goals    Frequency    Min 4X/week      PT Plan Current plan remains appropriate;Frequency needs to be updated    Co-evaluation     PT goals addressed during session: Mobility/safety with mobility        AM-PAC PT "6 Clicks" Daily Activity  Outcome Measure  Difficulty turning over in bed (including adjusting bedclothes, sheets and blankets)?: Unable Difficulty moving from lying on back to sitting on the side of the bed? : Unable Difficulty sitting down on and standing up from a chair with arms (e.g., wheelchair, bedside commode, etc,.)?: Unable Help needed moving to and from a bed to chair (including a wheelchair)?: A Lot Help needed walking in  hospital room?: Total Help needed climbing 3-5 steps with a railing? : Total 6 Click Score: 7    End of Session Equipment Utilized During Treatment: Gait belt;Cervical collar Activity Tolerance: Patient limited by fatigue;Patient limited by lethargy;Patient limited by pain Patient left: in chair;with call bell/phone within reach;with chair alarm set;with nursing/sitter in room Nurse Communication: Mobility status PT Visit Diagnosis: Unsteadiness on feet (R26.81);Other symptoms and signs involving the nervous system (R29.898);Muscle weakness (generalized) (M62.81);Hemiplegia and hemiparesis Hemiplegia - Right/Left: Right Hemiplegia - dominant/non-dominant:  (Still unsure of hand dominance) Hemiplegia - caused by: Unspecified     Time: 1610-9604 PT Time Calculation (min) (ACUTE ONLY): 26 min  Charges:                       G CodesMoise Murphy, SPT #[id number]   Nathaniel Murphy 03/04/2017, 11:17 AM

## 2017-03-05 ENCOUNTER — Inpatient Hospital Stay (HOSPITAL_COMMUNITY): Payer: Medicaid Other

## 2017-03-05 LAB — BASIC METABOLIC PANEL
Anion gap: 9 (ref 5–15)
BUN: 8 mg/dL (ref 6–20)
CHLORIDE: 101 mmol/L (ref 101–111)
CO2: 26 mmol/L (ref 22–32)
CREATININE: 0.78 mg/dL (ref 0.61–1.24)
Calcium: 9.1 mg/dL (ref 8.9–10.3)
GFR calc Af Amer: >60 mL/min (ref >60)
GFR calc non Af Amer: >60 mL/min (ref >60)
GLUCOSE: 96 mg/dL (ref 65–99)
Potassium: 3.7 mmol/L (ref 3.5–5.1)
Sodium: 136 mmol/L (ref 135–145)

## 2017-03-05 LAB — GLUCOSE, CAPILLARY
GLUCOSE-CAPILLARY: 105 mg/dL — AB (ref 65–99)
GLUCOSE-CAPILLARY: 84 mg/dL (ref 65–99)
GLUCOSE-CAPILLARY: 93 mg/dL (ref 65–99)
Glucose-Capillary: 91 mg/dL (ref 65–99)
Glucose-Capillary: 89 mg/dL (ref 65–99)

## 2017-03-05 MED ORDER — LEVETIRACETAM 100 MG/ML PO SOLN: 500.0000 mg | Freq: Two times a day (BID) | ORAL | Status: DC

## 2017-03-05 MED ORDER — SODIUM CHLORIDE 0.9 % IV SOLN
500.0000 mg | Freq: Two times a day (BID) | INTRAVENOUS | Status: DC
  Administered 2017-03-05 (×2): 500 mg via INTRAVENOUS
  Filled 2017-03-05 (×3): qty 5
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
## 2017-03-05 NOTE — Progress Notes (Signed)
Trauma Service Note  Subjective: Patient does not have a tube in place for medications or tube feedings now, therefore he has not been getting his sedatives and he is much more alert and following commands.  Pulled out CorTrak tube yesterday and they have not been able to get NGT in place  Objective: Vital signs in last 24 hours: Temp:  [97.6 F (36.4 C)-98.7 F (37.1 C)] 97.6 F (36.4 C) (10/31 0800) Pulse Rate:  [65-107] 102 (10/31 0800) Resp:  [17-28] 24 (10/31 0800) BP: (144-171)/(87-99) 156/98 (10/31 0800) SpO2:  [97 %-100 %] 100 % (10/31 0800) Weight:  [65.1 kg (143 lb 8.3 oz)] 65.1 kg (143 lb 8.3 oz) (10/31 0500) Last BM Date: 03/04/17  Intake/Output from previous day: 10/30 0701 - 10/31 0700 In: 1650 [I.V.:1050; IV Piggyback:600] Out: 4550 [Urine:4500; Stool:50] Intake/Output this shift: Total I/O In: 610 [I.V.:200; IV Piggyback:410] Out: 600 [Urine:600]  General: No distress  Lungs: Clear.  Coughs well and clears secretions.  Abd: Soft, benign and no pain  Extremities: No changes.  Neuro: Intact  Lab Results: CBC  No results for input(s): WBC, HGB, HCT, PLT in the last 72 hours. BMET  Recent Labs  03/04/17 0502 03/05/17 0529  NA 136 136  K 3.4* 3.7  CL 101 101  CO2 29 26  GLUCOSE 118* 96  BUN 8 8  CREATININE 0.71 0.78  CALCIUM 8.9 9.1   PT/INR No results for input(s): LABPROT, INR in the last 72 hours. ABG No results for input(s): PHART, HCO3 in the last 72 hours.  Invalid input(s): PCO2, PO2  Studies/Results: No results found.  Anti-infectives: Anti-infectives    Start     Dose/Rate Route Frequency Ordered Stop   02/28/17 2200  vancomycin (VANCOCIN) IVPB 750 mg/150 ml premix     750 mg 150 mL/hr over 60 Minutes Intravenous Every 8 hours 02/28/17 1359     02/28/17 1400  piperacillin-tazobactam (ZOSYN) IVPB 3.375 g     3.375 g 12.5 mL/hr over 240 Minutes Intravenous Every 8 hours 02/28/17 1307     02/28/17 1400  vancomycin (VANCOCIN)  1,250 mg in sodium chloride 0.9 % 250 mL IVPB     1,250 mg 166.7 mL/hr over 90 Minutes Intravenous  Once 02/28/17 1325 02/28/17 1515      Assessment/Plan: s/p Procedure(s): CRANIOTOMY HEMATOMA EVACUATION SUBDURAL See if ST can see patient again before CorTrak is palced.  He is much more alert and awake this Am.  LOS: 10 days   Marta LamasJames O. Gae BonWyatt, III, MD, FACS [phone] Trauma Surgeon 03/05/2017

## 2017-03-05 NOTE — Progress Notes (Signed)
Physical Therapy Treatment Patient Details Name: Nathaniel Murphy MRN: [id number] DOB: 1955-07-01 Today's Date: 03/05/2017    History of Present Illness pt is a 44 ylo male with pmh significant for alcoholism, anxiety and seizures, admitted after a fall down the steps in which he suffered a C2 body and C5 facet fxs along with multiple facial fx's including R orbit, frontal bone, zygoma, maxillary sinusand and SDH. Extra-articular distal L radius fracture with small ulnar styloid fx.  Pt s/p emergent crani for evacuation of hematoma, sedated and intubated. Extubated 10/26.    PT Comments    Pt alert but confused stating he fell out of a tree, is at [facility] and that the day is Saturday. Oriented pt but unable to recall even a few minutes later. Pt following commands for bil LE HEP but not during functional movement and transfers. Maintaining right lean in sitting and unable to fully extend trunk in standing. Will continue to follow and maximize function. CIR declined and SNF appropriate.    Follow Up Recommendations  SNF;Supervision/Assistance - 24 hour     Equipment Recommendations  Other (comment) (defer to next venue)    Recommendations for Other Services       Precautions / Restrictions Precautions Precautions: Fall;Cervical Required Braces or Orthoses: Cervical Brace Cervical Brace: At all times;Hard collar Other Brace/Splint: L volar wrist splint Restrictions LUE Weight Bearing: Weight bear through elbow only    Mobility  Bed Mobility Overal bed mobility: Needs Assistance Bed Mobility: Rolling;Sidelying to Sit Rolling: Max assist Sidelying to sit: Max assist       General bed mobility comments: max cues, increased time and assist to rotate and roll pelvis/trunk as well as elevate trunk. Pt with right lean EoB with inability to correct with cues and reverts to propping on Right elbow rather than sitting upright  Transfers Overall transfer level: Needs assistance    Transfers: Sit to/from Stand;Stand Pivot Transfers Sit to Stand: Mod assist;+2 physical assistance Stand pivot transfers: Max assist;+2 physical assistance       General transfer comment: pt with left knee blocked, assist for anterior translation and rise. Pt unable to achieve upright without max +2 assist. He maintains flexed trunk, right lean and flexed hips. Physical assist for trunk control  and pivoting to chair to right with drop arm  Ambulation/Gait             General Gait Details: unable at this time.    Stairs            Wheelchair Mobility    Modified Rankin (Stroke Patients Only) Modified Rankin (Stroke Patients Only) Pre-Morbid Rankin Score: No symptoms Modified Rankin: Severe disability     Balance Overall balance assessment: Needs assistance   Sitting balance-Leahy Scale: Poor Sitting balance - Comments: mod assist for sitting balance Postural control: Right lateral lean   Standing balance-Leahy Scale: Poor Standing balance comment: 2 person assist with right lean and knees blocked                            Cognition Arousal/Alertness: Awake/alert Behavior During Therapy: Flat affect Overall Cognitive Status: No family/caregiver present to determine baseline cognitive functioning Area of Impairment: Orientation               Rancho Levels of Cognitive Functioning Rancho Mirant Scales of Cognitive Functioning: Confused/appropriate Orientation Level: Disoriented to;Place;Time;Situation Current Attention Level: Sustained Memory: Decreased short-term memory Following Commands: Follows one step  commands inconsistently Safety/Judgement: Decreased awareness of deficits;Decreased awareness of safety   Problem Solving: Slow processing;Decreased initiation;Difficulty sequencing;Requires verbal cues;Requires tactile cues General Comments: pt following commands for bil LE HEP but not transfers      Exercises General Exercises  - Lower Extremity Long Arc Quad: AROM;Both;Seated;10 reps Hip Flexion/Marching: AROM;Both;Seated;10 reps    General Comments        Pertinent Vitals/Pain Pain Assessment: No/denies pain    Home Living                      Prior Function            PT Goals (current goals can now be found in the care plan section) Progress towards PT goals: Progressing toward goals (limited)    Frequency    Min 4X/week      PT Plan Discharge plan needs to be updated    Co-evaluation              AM-PAC PT "6 Clicks" Daily Activity  Outcome Measure  Difficulty turning over in bed (including adjusting bedclothes, sheets and blankets)?: Unable Difficulty moving from lying on back to sitting on the side of the bed? : Unable Difficulty sitting down on and standing up from a chair with arms (e.g., wheelchair, bedside commode, etc,.)?: Unable Help needed moving to and from a bed to chair (including a wheelchair)?: A Lot Help needed walking in hospital room?: Total Help needed climbing 3-5 steps with a railing? : Total 6 Click Score: 7    End of Session Equipment Utilized During Treatment: Gait belt;Cervical collar Activity Tolerance: Patient tolerated treatment well Patient left: in chair;with chair alarm set;with call bell/phone within reach;with restraints reapplied Nurse Communication: Mobility status;Precautions;Weight bearing status PT Visit Diagnosis: Unsteadiness on feet (R26.81);Other symptoms and signs involving the nervous system (R29.898);Muscle weakness (generalized) (M62.81)     Time: [id number] PT Time Calculation (min) (ACUTE ONLY): 24 min  Charges:  $Therapeutic Exercise: 8-22 mins $Therapeutic Activity: 8-22 mins                    G Codes:       Delaney MeigsMaija Tabor Milaya Hora, PT [phone]    Alekai Pocock B Jerzy Crotteau 03/05/2017, 9:38 AM

## 2017-03-05 NOTE — Progress Notes (Signed)
Modified Barium Swallow Progress Note  Patient Details  Name: Gerri SporeYancey I Lover MRN: [id number] Date of Birth: 10/05/1955  Today's Date: 03/05/2017  Modified Barium Swallow completed.  Full report located under Chart Review in the Imaging Section.  Brief recommendations include the following:  Clinical Impression  Pt presents with a moderate oral, mild pharyngeal dysphagia marked by poor oral attention with lingual pumping and reduced propulsion of solids into pharynx.  There is consistent puree reside that remains in the valleculae post swallow and clears after several spontaneous f/u subswallows.  Swallow triggers at level of  pyriforms for all liquids. Pt demonstrated good airway protection with solids and liquids, and only demonstrated one occurrence of high penetration of thin liquids (Penetration/Aspiration score of 2) when he consumed solids and liquids simultaneously with large, successive boluses of thin. There was no aspiration.  For now, recommend initiating a dysphagia 1 diet with thin liquids; crush meds in puree.  Assist with self-feeding and encourage slow rate of intake.  SLP will follow for safety and diet progression.    Swallow Evaluation Recommendations       SLP Diet Recommendations: Dysphagia 1 (Puree) solids;Thin liquid   Liquid Administration via: Cup   Medication Administration: Crushed with puree   Supervision: Staff to assist with self feeding   Compensations: Minimize environmental distractions;Slow rate;Small sips/bites       Oral Care Recommendations: Oral care BID        Blenda MountsCouture, Arianah Torgeson Laurice 03/05/2017,2:27 PM

## 2017-03-05 NOTE — [place] FL2 (Signed)
[place] MEDICAID FL2 LEVEL OF CARE SCREENING TOOL     IDENTIFICATION  Patient Name: Nathaniel Murphy Birthdate: 08/11/1955 Sex: male Admission Date (Current Location): 02/23/2017  [facility] and [place] Number:  Producer, television/film/video and Address:  [facility], [address] [place], [place] [address]      Provider Number: [id number]  Attending Physician Name and Address:  Md, Trauma, MD  Relative Name and Phone Number:       Current Level of Care: Hospital Recommended Level of Care: Skilled Nursing Facility Prior Approval Number:    Date Approved/Denied:   PASRR Number: [id number]  Discharge Plan: SNF    Current Diagnoses: Patient Active Problem List   Diagnosis Date Noted  . Generalized anxiety disorder   . Chronic pain syndrome   . Tachypnea   . Benign essential HTN   . Hypokalemia   . Leukocytosis   . Acute blood loss anemia   . S/P craniotomy   . Fall down stairs 03/01/2017  . Closed extraarticular fracture of distal radius, left, initial encounter 03/01/2017  . Subdural hematoma (HCC) 02/23/2017  . Acute subdural hematoma (HCC) 02/23/2017    Orientation RESPIRATION BLADDER Height & Weight     Self, Place  Normal Incontinent, External catheter Weight: 143 lb 8.3 oz (65.1 kg) Height:  5\' 11"  (180.3 cm)  BEHAVIORAL SYMPTOMS/MOOD NEUROLOGICAL BOWEL NUTRITION STATUS      Incontinent Diet (see DC summary)  AMBULATORY STATUS COMMUNICATION OF NEEDS Skin   Extensive Assist Verbally Normal                       Personal Care Assistance Level of Assistance  Bathing, Dressing Bathing Assistance: Maximum assistance   Dressing Assistance: Maximum assistance     Functional Limitations Info             SPECIAL CARE FACTORS FREQUENCY  PT (By licensed PT), OT (By licensed OT), Speech therapy     PT Frequency: 5/wk OT Frequency: 5/wk     Speech Therapy Frequency: 2/wk      Contractures      Additional Factors Info   Code Status, Allergies Code Status Info: FULL Allergies Info: Influenza Vaccines           Current Medications (03/05/2017):  This is the current hospital active medication list Current Facility-Administered Medications  Medication Dose Route Frequency Provider Last Rate Last Dose  . 0.9 %  sodium chloride infusion   Intravenous Continuous Coralyn Helling, MD 50 mL/hr at 03/05/17 1200    . acetaminophen (TYLENOL) suppository 650 mg  650 mg Rectal Q6H PRN Axel Filler, MD   650 mg at 02/28/17 1752   Or  . acetaminophen (TYLENOL) tablet 650 mg  650 mg Oral Q6H PRN Axel Filler, MD       Or  . acetaminophen (TYLENOL) solution 650 mg  650 mg Per Tube Q6H PRN Axel Filler, MD   650 mg at 03/03/17 0005  . bisacodyl (DULCOLAX) suppository 10 mg  10 mg Rectal Daily PRN Coletta Memos, MD      . chlorhexidine (PERIDEX) 0.12 % solution 15 mL  15 mL Mouth Rinse BID Axel Filler, MD   15 mL at 03/05/17 1035  . Chlorhexidine Gluconate Cloth 2 % PADS 6 each  6 each Topical Q0600 Violeta Gelinas, MD   6 each at 03/04/17 1006  . clonazePAM (KLONOPIN) tablet 0.5 mg  0.5 mg Per Tube BID Violeta Gelinas, MD  0.5 mg at 03/04/17 1003  . docusate (COLACE) 50 MG/5ML liquid 100 mg  100 mg Per Tube BID Coralyn HellingSood, Vineet, MD   100 mg at 03/01/17 2055  . enoxaparin (LOVENOX) injection 40 mg  40 mg Subcutaneous Q24H Meuth, Brooke A, PA-C   40 mg at 03/05/17 1033  . feeding supplement (VITAL AF 1.2 CAL) liquid 1,000 mL  1,000 mL Per Tube Continuous Coralyn HellingSood, Vineet, MD   Stopped at 03/04/17 1200  . folic acid (FOLVITE) tablet 1 mg  1 mg Per Tube Daily Coralyn HellingSood, Vineet, MD   1 mg at 03/04/17 1003  . hydrALAZINE (APRESOLINE) injection 10 mg  10 mg Intravenous Q4H PRN Violeta Gelinashompson, Burke, MD   10 mg at 03/05/17 1033  . hydrochlorothiazide 10 mg/mL oral suspension 13 mg  13 mg Per Tube Daily Joaquim Namowell, Lisa K, RPH      . ibuprofen (ADVIL,MOTRIN) 100 MG/5ML suspension 400 mg  400 mg Oral Q6H PRN Violeta Gelinashompson, Burke, MD   400 mg at  02/27/17 2044  . insulin aspart (novoLOG) injection 0-9 Units  0-9 Units Subcutaneous Q4H Mannam, Praveen, MD   1 Units at 03/04/17 1245  . ipratropium-albuterol (DUONEB) 0.5-2.5 (3) MG/3ML nebulizer solution 3 mL  3 mL Nebulization Q6H PRN Coralyn HellingSood, Vineet, MD   3 mL at 02/27/17 0053  . labetalol (NORMODYNE,TRANDATE) injection 10-40 mg  10-40 mg Intravenous Q10 min PRN Coletta Memosabbell, Kyle, MD   20 mg at 03/05/17 1119  . levETIRAcetam (KEPPRA) 500 mg in sodium chloride 0.9 % 100 mL IVPB  500 mg Intravenous Q12H Jimmye NormanWyatt, James, MD   Stopped at 03/05/17 1116  . LORazepam (ATIVAN) injection 1-2 mg  1-2 mg Intravenous Q1H PRN Alyson ReedyYacoub, Wesam G, MD   2 mg at 03/05/17 0110  . MEDLINE mouth rinse  15 mL Mouth Rinse q12n4p Axel Filleramirez, Armando, MD   15 mL at 03/05/17 1200  . metoprolol tartrate (LOPRESSOR) 25 mg/10 mL oral suspension 50 mg  50 mg Per Tube BID Violeta Gelinashompson, Burke, MD   50 mg at 03/04/17 1002  . multivitamin liquid 15 mL  15 mL Per Tube Daily Coralyn HellingSood, Vineet, MD   15 mL at 03/04/17 1002  . naloxone (NARCAN) injection 0.08 mg  0.08 mg Intravenous PRN Coletta Memosabbell, Kyle, MD      . ondansetron (ZOFRAN) injection 4 mg  4 mg Intravenous Q4H PRN Coletta Memosabbell, Kyle, MD      . pantoprazole sodium (PROTONIX) 40 mg/20 mL oral suspension 40 mg  40 mg Per Tube Daily Coralyn HellingSood, Vineet, MD   40 mg at 03/04/17 1003  . piperacillin-tazobactam (ZOSYN) IVPB 3.375 g  3.375 g Intravenous Evelina DunQ8H Wyatt, James, MD 12.5 mL/hr at 03/05/17 1411 3.375 g at 03/05/17 1411  . pneumococcal 23 valent vaccine (PNU-IMMUNE) injection 0.5 mL  0.5 mL Intramuscular Tomorrow-1000 Violeta Gelinashompson, Burke, MD      . promethazine (PHENERGAN) tablet 12.5-25 mg  12.5-25 mg Oral Q4H PRN Coletta Memosabbell, Kyle, MD      . QUEtiapine (SEROQUEL) tablet 50 mg  50 mg Per Tube BID Violeta Gelinashompson, Burke, MD   50 mg at 03/04/17 1003  . senna-docusate (Senokot-S) tablet 1 tablet  1 tablet Oral QHS PRN Coletta Memosabbell, Kyle, MD      . sodium chloride flush (NS) 0.9 % injection 10-40 mL  10-40 mL Intracatheter Q12H  Coletta Memosabbell, Kyle, MD   10 mL at 03/05/17 1034  . sodium chloride flush (NS) 0.9 % injection 10-40 mL  10-40 mL Intracatheter PRN Coletta Memosabbell, Kyle, MD      .  thiamine (VITAMIN B-1) tablet 100 mg  100 mg Per Tube Daily Coralyn Helling, MD   100 mg at 03/04/17 1003  . traMADol (ULTRAM) tablet 50 mg  50 mg Per Tube Q6H PRN Axel Filler, MD   50 mg at 03/04/17 0501  . vancomycin (VANCOCIN) IVPB 750 mg/150 ml premix  750 mg Intravenous [address] RPH 150 mL/hr at 03/05/17 1411 750 mg at 03/05/17 1411     Discharge Medications: Please see discharge summary for a list of discharge medications.  Relevant Imaging Results:  Relevant Lab Results:   Additional Information SS#: [id number]  Burna Sis, LCSW
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
## 2017-03-05 NOTE — Plan of Care (Signed)
Problem: Physical Regulation: Goal: Neurologic status will improve Outcome: Progressing Patient able to communicate better with staff, becoming more aware of surroundings and situation.

## 2017-03-06 LAB — BASIC METABOLIC PANEL
Anion gap: 7 (ref 5–15)
BUN: 10 mg/dL (ref 6–20)
CALCIUM: 7.8 mg/dL — AB (ref 8.9–10.3)
CHLORIDE: 109 mmol/L (ref 101–111)
CO2: 22 mmol/L (ref 22–32)
CREATININE: 0.71 mg/dL (ref 0.61–1.24)
GFR calc non Af Amer: >60 mL/min (ref >60)
GLUCOSE: 83 mg/dL (ref 65–99)
Potassium: 3 mmol/L — ABNORMAL LOW (ref 3.5–5.1)
Sodium: 138 mmol/L (ref 135–145)

## 2017-03-06 LAB — CBC WITH DIFFERENTIAL/PLATELET
Basophils Absolute: 0 10*3/uL (ref 0.0–0.1)
Basophils Relative: 0 %
Eosinophils Absolute: 0.3 10*3/uL (ref 0.0–0.7)
Eosinophils Relative: 3 %
HEMATOCRIT: 30.6 % — AB (ref 39.0–52.0)
HEMOGLOBIN: 10 g/dL — AB (ref 13.0–17.0)
LYMPHS ABS: 1.9 10*3/uL (ref 0.7–4.0)
LYMPHS PCT: 18 %
MCH: 30.6 pg (ref 26.0–34.0)
MCHC: 32.7 g/dL (ref 30.0–36.0)
MCV: 93.6 fL (ref 78.0–100.0)
MONO ABS: 1.1 10*3/uL — AB (ref 0.1–1.0)
MONOS PCT: 10 %
NEUTROS ABS: 7.4 10*3/uL (ref 1.7–7.7)
NEUTROS PCT: 69 %
Platelets: 591 10*3/uL — ABNORMAL HIGH (ref 150–400)
RBC: 3.27 MIL/uL — ABNORMAL LOW (ref 4.22–5.81)
RDW: 14.1 % (ref 11.5–15.5)
WBC: 10.6 10*3/uL — ABNORMAL HIGH (ref 4.0–10.5)

## 2017-03-06 LAB — GLUCOSE, CAPILLARY
GLUCOSE-CAPILLARY: 117 mg/dL — AB (ref 65–99)
Glucose-Capillary: 84 mg/dL (ref 65–99)
Glucose-Capillary: 98 mg/dL (ref 65–99)

## 2017-03-06 MED ORDER — FOLIC ACID 1 MG PO TABS
1.0000 mg | ORAL_TABLET | Freq: Every day | ORAL | Status: DC
  Administered 2017-03-06 – 2017-03-26 (×21): 1 mg via ORAL
  Filled 2017-03-06 (×21): qty 1

## 2017-03-06 MED ORDER — TRAMADOL HCL 50 MG PO TABS
50.0000 mg | ORAL_TABLET | Freq: Four times a day (QID) | ORAL | Status: DC | PRN
  Administered 2017-03-06 – 2017-03-26 (×18): 50 mg via ORAL
  Filled 2017-03-06 (×18): qty 1

## 2017-03-06 MED ORDER — POTASSIUM CHLORIDE CRYS ER 20 MEQ PO TBCR
20.0000 meq | EXTENDED_RELEASE_TABLET | Freq: Three times a day (TID) | ORAL | Status: DC
  Administered 2017-03-06 (×2): 20 meq via ORAL
  Filled 2017-03-06 (×2): qty 1

## 2017-03-06 MED ORDER — POTASSIUM CHLORIDE 20 MEQ/15ML (10%) PO SOLN
20.0000 meq | Freq: Three times a day (TID) | ORAL | Status: DC
  Administered 2017-03-06 – 2017-03-14 (×23): 20 meq via ORAL
  Filled 2017-03-06 (×24): qty 15

## 2017-03-06 MED ORDER — LEVETIRACETAM 500 MG PO TABS
500.0000 mg | ORAL_TABLET | Freq: Two times a day (BID) | ORAL | Status: DC
  Administered 2017-03-06: 500 mg via ORAL
  Filled 2017-03-06: qty 1

## 2017-03-06 MED ORDER — ENSURE ENLIVE PO LIQD
237.0000 mL | Freq: Two times a day (BID) | ORAL | Status: DC
  Administered 2017-03-06 – 2017-03-24 (×29): 237 mL via ORAL

## 2017-03-06 MED ORDER — DOCUSATE SODIUM 50 MG/5ML PO LIQD
100.0000 mg | Freq: Two times a day (BID) | ORAL | Status: DC
  Administered 2017-03-06 – 2017-03-18 (×17): 100 mg via ORAL
  Filled 2017-03-06 (×20): qty 10

## 2017-03-06 MED ORDER — HYDROCHLOROTHIAZIDE 10 MG/ML ORAL SUSPENSION
12.5000 mg | Freq: Every day | ORAL | Status: DC
  Administered 2017-03-06 – 2017-03-18 (×11): 13 mg via ORAL
  Filled 2017-03-06 (×19): qty 1.88

## 2017-03-06 MED ORDER — VITAMIN B-1 100 MG PO TABS
100.0000 mg | ORAL_TABLET | Freq: Every day | ORAL | Status: DC
  Administered 2017-03-06 – 2017-03-26 (×21): 100 mg via ORAL
  Filled 2017-03-06 (×21): qty 1

## 2017-03-06 MED ORDER — PANTOPRAZOLE SODIUM 40 MG PO PACK
40.0000 mg | PACK | Freq: Every day | ORAL | Status: DC
  Administered 2017-03-06 – 2017-03-18 (×13): 40 mg via ORAL
  Filled 2017-03-06 (×14): qty 20

## 2017-03-06 MED ORDER — LEVETIRACETAM 100 MG/ML PO SOLN
500.0000 mg | Freq: Two times a day (BID) | ORAL | Status: DC
  Administered 2017-03-06 – 2017-03-25 (×37): 500 mg via ORAL
  Filled 2017-03-06 (×38): qty 5

## 2017-03-06 MED ORDER — ADULT MULTIVITAMIN LIQUID CH
15.0000 mL | Freq: Every day | ORAL | Status: DC
  Administered 2017-03-06 – 2017-03-18 (×13): 15 mL via ORAL
  Filled 2017-03-06 (×14): qty 15

## 2017-03-06 MED ORDER — METOPROLOL TARTRATE 25 MG/10 ML ORAL SUSPENSION
50.0000 mg | Freq: Two times a day (BID) | ORAL | Status: DC
  Administered 2017-03-06 – 2017-03-11 (×11): 50 mg via ORAL
  Filled 2017-03-06 (×11): qty 20

## 2017-03-06 NOTE — Discharge Summary (Signed)
[facility] Discharge Summary   Patient ID: Nathaniel Murphy MRN: [id number] DOB/AGE: 10/14/1955 61 y.o.  Admit date: 02/23/2017 Discharge date: 03/26/2017  Admitting Diagnosis: Fall SDH Epidural hematoma C2 vertebral body fracture C5 left articular facet fracture Right frontal bone fracture Right zygomatic maxillary fracture  Discharge Diagnosis Patient Active Problem List   Diagnosis Date Noted  . Generalized anxiety disorder   . Chronic pain syndrome   . Tachypnea   . Benign essential HTN   . Hypokalemia   . Leukocytosis   . Acute blood loss anemia   . S/P craniotomy   . Fall down stairs 03/01/2017  . Closed extraarticular fracture of distal radius, left, initial encounter 03/01/2017  . Subdural hematoma (HCC) 02/23/2017  . Acute subdural hematoma (HCC) 02/23/2017    Consultants Orthopedics ENT Trauma Critical care  Imaging: No results found.  Procedures Dr. Franky Macho (02/23/17) - Left frontotemporoparietal CRANIOTOMY for Subdural HEMATOMA EVACUATION  Hospital Course:  Nathaniel Murphy is a 61yo male who was [facility] after being found in bed by his son incontinent of urine.  He had fallen down the stairs at home on 10/19 but refused to go to the hospital. Unresponsive in the ED. Patient was intubated. Workup showed large subdural hematoma, epidural hematoma, C2 vertebral body fracture, C5 left articular facet fracture, Right frontal bone fracture, and Right zygomatic maxillary fracture. Patient was taken emergently to the OR by neurosurgery for craniotomy and SDH evacuation. Postoperatively the patient was admitted to the ICU by neurosurgery. Critical care consulted for assistance with ventilator and medical management. He required precedex for alcohol withdrawal. ENT consulted for facial fractures and recommended nonoperative management. Neurosurgery recommended c-collar for C2 and C5 fractures. His hemoglobin 10/22 was noted to be 7.5;  it drifted down to 6.4 10/23 and trauma was asked to see him for further trauma evaluation. He was given 2 units PRBC. Bedside FAST showed no evidence of intra-abdominal hemorrhage. CT abdomen/pelvis showed no evidence of solid organ injury or source of bleeding; it did incidentally show a left distal radius fracture which was confirmed on xray. Orthopedics was consulted and recommended nonoperative management in a splint, NWB LUE but ok to bear weight through elbow. He required empiric antibiotics for pneumonia starting on 10/21. PICC was removed on 10/25. Sedation was weaned and patient was successfully extubated on 10/26. Patient underwent modified barium swallow and was able to advance to a dysphagia 3 diet. Neurocognitive status slowly improved and patient progressed well with therapies. Sitter discontinued on 11/20. C collar was refitted on 11/20 due to pressure ulcer on the posterior neck. On 03/26/17 the patients vitals were stable, pain controlled, tolerating a diet, walking with therapies, having bowel function, and stable for discharge to a skilled nursing facility. C-collar will likely need to remain in place for a total of 12 weeks, this can be determined at his outpatient neurosurgery follow up.  Patient will follow up as below and knows to call with questions or concerns.     I was not directly involved in this patient's care for the majority of his hospitalization, therefore the information in this discharge summary obtained primarily from chart review.  Physical Exam: Gen: Alert, NAD, resting comfortably  HEENT:c-collar in place. well healingincision with no erythema or drainage, pupils equal and round; occipital pressure wound~2 cm without s/s infection - there is a small amount overlying slough. Card: RRR, no M/G/R appreciated Pulm: normal effort,CTAB, no W/R/R Abd: Soft, NT/ND, +BS Skin: no  rashes noted, warm and dry Extremities:splint to left wrist, able to move all  fingers, brisk cap refill Neuro: no sensory deficits, moves all extremities spontaneously   Allergies as of 03/25/2017      Reactions   Influenza Vaccines Diarrhea, Nausea And Vomiting   Arm swelling      Medication List    TAKE these medications   acetaminophen 325 MG tablet Commonly known as:  TYLENOL Take 2 tablets (650 mg total) by mouth every 6 (six) hours as needed for mild pain or fever (>101F).   alum & mag hydroxide-simeth 200-200-20 MG/5ML suspension Commonly known as:  MAALOX/MYLANTA Take 30 mLs by mouth every 6 (six) hours as needed for indigestion or heartburn.   ANTISEPTIC MOUTH RINSE MT Use as directed 5 mLs in the mouth or throat as needed (pain/irritation).   bismuth subsalicylate 262 MG/15ML suspension Commonly known as:  PEPTO BISMOL Take 30 mLs by mouth every 6 (six) hours as needed for indigestion.   CENTRUM ADULTS PO Take 1 tablet by mouth daily.   clonazePAM 0.5 MG tablet Commonly known as:  KLONOPIN Take 1 tablet (0.5 mg total) by mouth at bedtime.   docusate sodium 100 MG capsule Commonly known as:  COLACE Take 1 capsule (100 mg total) by mouth 2 (two) times daily.   feeding supplement (ENSURE ENLIVE) Liqd Take 237 mLs by mouth 3 (three) times daily between meals.   folic acid 1 MG tablet Commonly known as:  FOLVITE Take 1 tablet (1 mg total) by mouth daily. Start taking on:  03/26/2017   hydrochlorothiazide 12.5 MG capsule Commonly known as:  MICROZIDE Take 1 capsule (12.5 mg total) by mouth daily. Start taking on:  03/26/2017   ibuprofen 200 MG tablet Commonly known as:  ADVIL,MOTRIN Take 400 mg by mouth every 4 (four) hours as needed (pain).   metoprolol tartrate 50 MG tablet Commonly known as:  LOPRESSOR Take 1 tablet (50 mg total) by mouth 2 (two) times daily.   pantoprazole 40 MG tablet Commonly known as:  PROTONIX Take 1 tablet (40 mg total) by mouth daily.   potassium chloride SA 20 MEQ tablet Commonly known as:   K-DUR,KLOR-CON Take 1 tablet (20 mEq total) by mouth 3 (three) times daily.   thiamine 100 MG tablet Take 1 tablet (100 mg total) by mouth daily. Start taking on:  03/26/2017   traMADol 50 MG tablet Commonly known as:  ULTRAM Take 1 tablet (50 mg total) by mouth every 6 (six) hours as needed for moderate pain.       Follow-up Information    Coletta Memosabbell, Kyle, MD Follow up.   Specialty:  Neurosurgery Contact information: [address] [place] [address] [phone]        Flo ShanksWolicki, Karol, MD Follow up.   Specialty:  Otolaryngology Contact information: [address] [place] [address] [phone]        Roby LoftsHaddix, Kevin P, MD Follow up.   Specialty:  Orthopedic Surgery Contact information: [address] [place] [address] [phone]        [facility]. Call.   Why:  as needed Contact information: [address] [place] [id number] [phone]           Signed: Hosie SpangleElizabeth Fareeha Evon, [facility] Pager: [phone] Consults: [phone] Mon-Fri 7:00 am-4:30 pm Sat-Sun 7:00 am-11:30 am

## 2017-03-06 NOTE — Progress Notes (Signed)
Rehab admissions - Please see notes from Fae PippinMelissa Bowie on 10/30 and from social worker, Verdon CumminsJesse.  Patient has no caregiver support after a potential rehab stay. Best option at this point is SNF placement.  Call me for questions.  #[phone]

## 2017-03-06 NOTE — Progress Notes (Signed)
Trauma Service Note  Subjective: Patient is very alert and oriented this AM.  Remembers a lot about the event.  Still picking at things making mittens necessary right now.  Objective: Vital signs in last 24 hours: Temp:  [97.9 F (36.6 C)-98.4 F (36.9 C)] 98.3 F (36.8 C) (11/01 0759) Pulse Rate:  [64-91] 66 (11/01 0804) Resp:  [16-28] 18 (11/01 0759) BP: (144-170)/(90-109) 161/90 (11/01 0804) SpO2:  [97 %-100 %] 100 % (11/01 0759) Weight:  [66.2 kg (145 lb 15.1 oz)] 66.2 kg (145 lb 15.1 oz) (11/01 0500) Last BM Date: 03/05/17 (rectal pouch)  Intake/Output from previous day: 10/31 0701 - 11/01 0700 In: 3000 [P.O.:480; I.V.:1300; IV Piggyback:1220] Out: 3450 [Urine:3050; Stool:400] Intake/Output this shift: No intake/output data recorded.  General: No distress.  Very calm  Lungs: Clear.  Abd: Benign  Extremities: No changes  Neuro: Intact, oriented and would be appropriate for Rehab.  Lab Results: CBC   Recent Labs  03/06/17 0612  WBC 10.6*  HGB 10.0*  HCT 30.6*  PLT 591*   BMET  Recent Labs  03/05/17 0529 03/06/17 0612  NA 136 138  K 3.7 3.0*  CL 101 109  CO2 26 22  GLUCOSE 96 83  BUN 8 10  CREATININE 0.78 0.71  CALCIUM 9.1 7.8*   PT/INR No results for input(s): LABPROT, INR in the last 72 hours. ABG No results for input(s): PHART, HCO3 in the last 72 hours.  Invalid input(s): PCO2, PO2  Studies/Results: No results found.  Anti-infectives: Anti-infectives    Start     Dose/Rate Route Frequency Ordered Stop   02/28/17 2200  vancomycin (VANCOCIN) IVPB 750 mg/150 ml premix     750 mg 150 mL/hr over 60 Minutes Intravenous Every 8 hours 02/28/17 1359 03/07/17 2159   02/28/17 1400  piperacillin-tazobactam (ZOSYN) IVPB 3.375 g     3.375 g 12.5 mL/hr over 240 Minutes Intravenous Every 8 hours 02/28/17 1307 03/07/17 1359   02/28/17 1400  vancomycin (VANCOCIN) 1,250 mg in sodium chloride 0.9 % 250 mL IVPB     1,250 mg 166.7 mL/hr over 90  Minutes Intravenous  Once 02/28/17 1325 02/28/17 1515      Assessment/Plan: s/p Procedure(s): CRANIOTOMY HEMATOMA EVACUATION SUBDURAL Stop antibiotics that were started empirically.  I believe the best disposition for this patient would be Rehab.  He can go there at anytime.    LOS: 11 days   Marta LamasJames O. Gae BonWyatt, III, MD, FACS [phone] Trauma Surgeon 03/06/2017

## 2017-03-06 NOTE — Progress Notes (Signed)
Nutrition Follow-up  DOCUMENTATION CODES:   Not applicable  INTERVENTION:  Provide Ensure Enlive po BID, each supplement provides 350 kcal and 20 grams of protein.  Encourage adequate PO intake.   NUTRITION DIAGNOSIS:   Inadequate oral intake related to inability to eat as evidenced by NPO status; diet advanced; improving  GOAL:   Patient will meet greater than or equal to 90% of their needs; progressing  MONITOR:   Supplement acceptance, PO intake, Diet advancement, Labs, Weight trends, Skin, I & O's  REASON FOR ASSESSMENT:   Consult, Ventilator    ASSESSMENT:   Pt with PMH of ETOH abuse admitted s/p fall with SDH, C2 fracture, s/p crani 10/21.   NGT removed 10/30. Diet has been advanced to a dysphagia 1 diet with thin liquids. Meal completion 100%. Pt reports eating well at meals. RD to order nutritional supplements to aid in nutrition needs. Labs and medications reviewed.   Diet Order:  DIET - DYS 1 Room service appropriate? Yes; Fluid consistency: Thin  EDUCATION NEEDS:   No education needs identified at this time  Skin:  Skin Assessment: Skin Integrity Issues: (Incision to L head)  Last BM:  11/1-400 ml rectal tube  Height:   Ht Readings from Last 1 Encounters:  02/23/17 5\' 11"  (1.803 m)    Weight:   Wt Readings from Last 1 Encounters:  03/06/17 145 lb 15.1 oz (66.2 kg)    Ideal Body Weight:  78.1 kg  BMI:  Body mass index is 20.36 kg/m.  Estimated Nutritional Needs:   Kcal:  [id number]  Protein:  90-115 grams  Fluid:  > 2 L/day    Roslyn SmilingStephanie Delmi Fulfer, MS, RD, LDN Pager # [phone] After hours/ weekend pager # [phone]

## 2017-03-07 LAB — BASIC METABOLIC PANEL
Anion gap: 8 (ref 5–15)
BUN: 14 mg/dL (ref 6–20)
CALCIUM: 9.3 mg/dL (ref 8.9–10.3)
CO2: 24 mmol/L (ref 22–32)
CREATININE: 0.8 mg/dL (ref 0.61–1.24)
Chloride: 103 mmol/L (ref 101–111)
GFR calc non Af Amer: >60 mL/min (ref >60)
GLUCOSE: 96 mg/dL (ref 65–99)
Potassium: 4 mmol/L (ref 3.5–5.1)
Sodium: 135 mmol/L (ref 135–145)
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
## 2017-03-07 NOTE — Progress Notes (Signed)
Trauma Service Note  Subjective: Patient is responsive, but still is a bit difficult to communicate with.  He follows commands easily.  Not completely oriented.  Objective: Vital signs in last 24 hours: Temp:  [97.5 F (36.4 C)-98.4 F (36.9 C)] 97.7 F (36.5 C) (11/02 0730) Pulse Rate:  [71-94] 71 (11/02 0730) Resp:  [14-21] 17 (11/02 0730) BP: (146-166)/(84-109) 149/95 (11/02 0730) SpO2:  [93 %-97 %] 95 % (11/02 0730) Weight:  [59.9 kg (132 lb)] 59.9 kg (132 lb) (11/02 0700) Last BM Date: 03/07/17  Intake/Output from previous day: 11/01 0701 - 11/02 0700 In: 940 [P.O.:840; I.V.:100] Out: 2125 [Urine:2125] Intake/Output this shift: Total I/O In: -  Out: 250 [Urine:250]  General: No acute distress.  Wants to take a shower  Lungs: Clear to auscultation.  Abd: Benign  Extremities: No changes  Neuro: Intact  Lab Results: CBC   Recent Labs  03/06/17 0612  WBC 10.6*  HGB 10.0*  HCT 30.6*  PLT 591*   BMET  Recent Labs  03/06/17 0612 03/07/17 0615  NA 138 135  K 3.0* 4.0  CL 109 103  CO2 22 24  GLUCOSE 83 96  BUN 10 14  CREATININE 0.71 0.80  CALCIUM 7.8* 9.3   PT/INR No results for input(s): LABPROT, INR in the last 72 hours. ABG No results for input(s): PHART, HCO3 in the last 72 hours.  Invalid input(s): PCO2, PO2  Studies/Results: No results found.  Anti-infectives: Anti-infectives    Start     Dose/Rate Route Frequency Ordered Stop   02/28/17 2200  vancomycin (VANCOCIN) IVPB 750 mg/150 ml premix  Status:  Discontinued     750 mg 150 mL/hr over 60 Minutes Intravenous Every 8 hours 02/28/17 1359 03/06/17 0852   02/28/17 1400  piperacillin-tazobactam (ZOSYN) IVPB 3.375 g  Status:  Discontinued     3.375 g 12.5 mL/hr over 240 Minutes Intravenous Every 8 hours 02/28/17 1307 03/06/17 0852   02/28/17 1400  vancomycin (VANCOCIN) 1,250 mg in sodium chloride 0.9 % 250 mL IVPB     1,250 mg 166.7 mL/hr over 90 Minutes Intravenous  Once 02/28/17  1325 02/28/17 1515      Assessment/Plan: s/p Procedure(s): CRANIOTOMY HEMATOMA EVACUATION SUBDURAL Advance diet Work with therapies and try to get ready for SNF  LOS: 12 days   Marta LamasJames O. Gae BonWyatt, III, MD, FACS [phone] Trauma Surgeon 03/07/2017
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
## 2017-03-07 NOTE — Progress Notes (Addendum)
  Speech Language Pathology Treatment: Dysphagia  Patient Details Name: Nathaniel Murphy MRN: [id number] DOB: Aug 13, 1955 Today's Date: 03/07/2017 Time: [id number] SLP Time Calculation (min) (ACUTE ONLY): 7 min  Assessment / Plan / Recommendation Clinical Impression  Treatment focused on diet tolerance s/p MBS 10/31. Patient lethargic but arousable, agreeable to po trials provided by SLP however required ongoing cueing in between boluses to sustain adequate level of alertness. Although po trials minimal, patient with seemingly good airway protection without significant s/s of aspiration. Subtle wet vocal quality x1 noted, clearing quickly with subswallow. Overall, appears to be tolerating current diet. Cognition not addressed this session due to lethargy.    HPI HPI: Nathaniel HamperYancey I Gregoryis a60 y.o.maleadmittedon 10/21/2018after being found unresponsive, found to have SDH. S/p craniotomy with evacutation. Remained intubated, Extubated 10/26. Found to have left distal radius fracture due to fall on 10/19 prior to admission as well as right frontal bone, orbit, maxillary sinus and zygoma fractures.       SLP Plan  Continue with current plan of care       Recommendations  Diet recommendations: Dysphagia 1 (puree);Thin liquid Liquids provided via: Cup;Straw Medication Administration: Crushed with puree Supervision: Staff to assist with self feeding;Full supervision/cueing for compensatory strategies Compensations: Minimize environmental distractions;Slow rate;Small sips/bites Postural Changes and/or Swallow Maneuvers: Seated upright 90 degrees                Oral Care Recommendations: Oral care BID Follow up Recommendations: Inpatient Rehab SLP Visit Diagnosis: Dysphagia, oropharyngeal phase (R13.12) Plan: Continue with current plan of care       GO             Nathaniel LangoLeah Rosalinda Seaman MA, CCC-SLP [phone]    Nathaniel Murphy 03/07/2017, 1:05 PM
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
## 2017-03-08 NOTE — Progress Notes (Signed)
13 Days Post-Op   Subjective/Chief Complaint: No complaints, answers appropriately, said he had bm, tol diet, hungry now   Objective: Vital signs in last 24 hours: Temp:  [97.6 F (36.4 C)-98.9 F (37.2 C)] 97.6 F (36.4 C) (11/03 16100638) Pulse Rate:  [66-86] 71 (11/03 0600) Resp:  [13-20] 15 (11/03 0600) BP: (115-154)/(77-90) 146/82 (11/03 0600) SpO2:  [94 %-98 %] 95 % (11/03 0600) Weight:  [56.3 kg (124 lb 3.2 oz)] 56.3 kg (124 lb 3.2 oz) (11/03 0633) Last BM Date: 03/07/17  Intake/Output from previous day: 11/02 0701 - 11/03 0700 In: 1200 [P.O.:1200] Out: 1725 [Urine:1725] Intake/Output this shift: No intake/output data recorded.  General no distress Lungs clear cv rrr abd soft nt Neurologically intact  Lab Results:   Recent Labs  03/06/17 0612  WBC 10.6*  HGB 10.0*  HCT 30.6*  PLT 591*   BMET  Recent Labs  03/06/17 0612 03/07/17 0615  NA 138 135  K 3.0* 4.0  CL 109 103  CO2 22 24  GLUCOSE 83 96  BUN 10 14  CREATININE 0.71 0.80  CALCIUM 7.8* 9.3   PT/INR No results for input(s): LABPROT, INR in the last 72 hours. ABG No results for input(s): PHART, HCO3 in the last 72 hours.  Invalid input(s): PCO2, PO2  Studies/Results: No results found.  Anti-infectives: Anti-infectives    Start     Dose/Rate Route Frequency Ordered Stop   02/28/17 2200  vancomycin (VANCOCIN) IVPB 750 mg/150 ml premix  Status:  Discontinued     750 mg 150 mL/hr over 60 Minutes Intravenous Every 8 hours 02/28/17 1359 03/06/17 0852   02/28/17 1400  piperacillin-tazobactam (ZOSYN) IVPB 3.375 g  Status:  Discontinued     3.375 g 12.5 mL/hr over 240 Minutes Intravenous Every 8 hours 02/28/17 1307 03/06/17 0852   02/28/17 1400  vancomycin (VANCOCIN) 1,250 mg in sodium chloride 0.9 % 250 mL IVPB     1,250 mg 166.7 mL/hr over 90 Minutes Intravenous  Once 02/28/17 1325 02/28/17 1515      Assessment/Plan: SDH s/p crani  Continue diet Therapies dispo  pending  Nathaniel Murphy 03/08/2017

## 2017-03-09 NOTE — Progress Notes (Signed)
14 Days Post-Op   Subjective/Chief Complaint: No issues/complaints Nurse reports more approp behavior    Objective: Vital signs in last 24 hours: Temp:  [97.7 F (36.5 C)-99.7 F (37.6 C)] 99.7 F (37.6 C) (11/04 0715) Pulse Rate:  [65-95] 68 (11/04 0800) Resp:  [14-20] 16 (11/04 0800) BP: (120-150)/(80-97) 120/80 (11/04 0800) SpO2:  [92 %-100 %] 96 % (11/04 0800) Weight:  [56.7 kg (125 lb 0 oz)] 56.7 kg (125 lb 0 oz) (11/04 0500) Last BM Date: 03/08/17  Intake/Output from previous day: 11/03 0701 - 11/04 0700 In: 240 [P.O.:240] Out: 2600 [Urine:2600] Intake/Output this shift: No intake/output data recorded.  Alert, awake Ox2 +collar cta b/l; symmetric chest rise Reg Soft, nt, nd No edema, +SCDs  Lab Results:  No results for input(s): WBC, HGB, HCT, PLT in the last 72 hours. BMET Recent Labs    03/07/17 0615  NA 135  K 4.0  CL 103  CO2 24  GLUCOSE 96  BUN 14  CREATININE 0.80  CALCIUM 9.3   PT/INR No results for input(s): LABPROT, INR in the last 72 hours. ABG No results for input(s): PHART, HCO3 in the last 72 hours.  Invalid input(s): PCO2, PO2  Studies/Results: No results found.  Anti-infectives: Anti-infectives (From admission, onward)   Start     Dose/Rate Route Frequency Ordered Stop   02/28/17 2200  vancomycin (VANCOCIN) IVPB 750 mg/150 ml premix  Status:  Discontinued     750 mg 150 mL/hr over 60 Minutes Intravenous Every 8 hours 02/28/17 1359 03/06/17 0852   02/28/17 1400  piperacillin-tazobactam (ZOSYN) IVPB 3.375 g  Status:  Discontinued     3.375 g 12.5 mL/hr over 240 Minutes Intravenous Every 8 hours 02/28/17 1307 03/06/17 0852   02/28/17 1400  vancomycin (VANCOCIN) 1,250 mg in sodium chloride 0.9 % 250 mL IVPB     1,250 mg 166.7 mL/hr over 90 Minutes Intravenous  Once 02/28/17 1325 02/28/17 1515      Assessment/Plan: s/p Procedure(s): CRANIOTOMY HEMATOMA EVACUATION SUBDURAL (Left)  SDH s/p crani  Continue  diet Therapies dispo pending    LOS: 14 days    Nathaniel Murphy,Nathaniel Murphy 03/09/2017

## 2017-03-09 NOTE — Plan of Care (Signed)
Patient continually encouraged to get OOB, increasing activity as able and feed self. Patient tolerating well with limited pain.

## 2017-03-10 MED ORDER — GUAIFENESIN 100 MG/5ML PO SOLN
10.0000 mL | Freq: Four times a day (QID) | ORAL | Status: DC | PRN
  Administered 2017-03-10: 200 mg via ORAL
  Filled 2017-03-10: qty 5
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
## 2017-03-10 NOTE — Plan of Care (Signed)
Pt up to chair and tolerates well. PT and OT seeing pt.  No anxiety noted.  Tolerating food.  On RA satting in upper 90's.  Incision CDI.  No c/o pain.  VSS.  Mildly confused.  CM working on placement post dc.
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
## 2017-03-10 NOTE — Clinical Social Work Note (Signed)
Clinical Social Worker continuing to follow patient and family for support and discharge planning needs.  CSW spoke with patient daughter at bedside regarding placement options for patient at discharge.  [place] however patient is a [place] and therefore facility could be located anywhere in the state.  CSW has initiated SNF search and expanded SNF search to [place] [facility] [place]  Patient daughter plans to do some of her own research and if she is unsatisfied with facility choice, she is aware that the alternative is patient will return home with family.  CSW remains available for support and to facilitate patient discharge needs once bed becomes available.  Macario GoldsJesse Yamira Papa, Kentucky[place]CS[place] [phone]
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
## 2017-03-11 LAB — BASIC METABOLIC PANEL
ANION GAP: 8 (ref 5–15)
BUN: 15 mg/dL (ref 6–20)
CALCIUM: 10.3 mg/dL (ref 8.9–10.3)
CO2: 26 mmol/L (ref 22–32)
Chloride: 100 mmol/L — ABNORMAL LOW (ref 101–111)
Creatinine, Ser: 0.86 mg/dL (ref 0.61–1.24)
Glucose, Bld: 103 mg/dL — ABNORMAL HIGH (ref 65–99)
Potassium: 4.3 mmol/L (ref 3.5–5.1)
SODIUM: 134 mmol/L — AB (ref 135–145)

## 2017-03-11 MED ORDER — METOPROLOL TARTRATE 25 MG/10 ML ORAL SUSPENSION
100.0000 mg | Freq: Two times a day (BID) | ORAL | Status: DC
  Administered 2017-03-11 – 2017-03-14 (×6): 100 mg via ORAL
  Filled 2017-03-11 (×6): qty 40

## 2017-03-11 MED ORDER — METOPROLOL TARTRATE 5 MG/5ML IV SOLN
5.0000 mg | Freq: Once | INTRAVENOUS | Status: AC
  Administered 2017-03-11: 5 mg via INTRAVENOUS
  Filled 2017-03-11: qty 5

## 2017-03-11 MED ORDER — CLONAZEPAM 0.5 MG PO TABS
0.2500 mg | ORAL_TABLET | Freq: Every day | ORAL | Status: DC
  Administered 2017-03-11 – 2017-03-13 (×3): 0.25 mg via ORAL
  Filled 2017-03-11 (×3): qty 1

## 2017-03-11 NOTE — Plan of Care (Signed)
Pt progressing towards reaching goals. Pt still confused at times.
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
## 2017-03-12 ENCOUNTER — Other Ambulatory Visit: Payer: Self-pay
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
## 2017-03-12 NOTE — Progress Notes (Signed)
  Speech Language Pathology Treatment: Dysphagia;Cognitive-Linquistic  Patient Details Name: Nathaniel Murphy MRN: [id number] DOB: 1955/08/01 Today's Date: 03/12/2017 Time: [id number] SLP Time Calculation (min) (ACUTE ONLY): 21 min  Assessment / Plan / Recommendation Clinical Impression  Skilled observation of intake of Dysphagia 1 (puree)/thin liquid diet for portion of lunch tray with nursing initiating feeding with SLP facilitating compensatory strategies including utilizing small bites/sips, limiting environmental distractions, sips of thin liquids via straw, alternating liquids/solids and intermittent sub-swallows noted during intake; delayed cough noted after completion of intake x1 with pt instructed to re-swallow to eliminate potential pharyngeal residue and for pt to reduce or eliminate talking during po intake; alertness level much improved this date compared to last session; pt required moderate cueing d/t impaired attention as he continuously stated "you need to help my son, his Mother isn't around"; he is not oriented to situation nor place, but only to self; problem solving with simple situations impaired as well; pt unable to recall any swallowing strategies during intake without moderate verbal cueing; continue ST for dysphagia/cognition and potential diet upgrade as mentation improves.   HPI HPI: Nathaniel Murphy was found in bed incontinent of urine by his son at [facility]iel Murphy home. He had fallen down the stairs at home on 10/19 but refused to go to the hospital. CT showed large acute subdural hematoma on the left. Also has C2 fracture. Nor responsive      SLP Plan  Continue with current plan of care       Recommendations  Diet recommendations: Dysphagia 1 (puree);Thin liquid Liquids provided via: Straw Medication Administration: Crushed with puree Supervision: Full supervision/cueing for compensatory strategies Compensations: Minimize environmental distractions;Slow rate;Small  sips/bites;Follow solids with liquid                Oral Care Recommendations: Oral care BID Follow up Recommendations: Other (comment)(TBD) SLP Visit Diagnosis: Dysphagia, oropharyngeal phase (R13.12) Plan: Continue with current plan of care                       Tressie StalkerPat Adams, M.S., CCC-SLP 03/12/2017, 3:52 PM
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
## 2017-03-13 NOTE — Progress Notes (Signed)
OT NOTE  RN STAFF  Please check splint every 4 hours during shift ( remove splint , remove stockinette/ dressing present) to assess for: * pain * redness *swelling  If any symptoms above present remove splint for 15 minutes. If symptoms continue - keep the splint removed and notify OT staff [phone] immediately.     Splint can be cleaned with warm soapy water and alcohol swab. Splint should not be placed in heat of any kind because the splint with mold into a new shape.   [facility] Matilynn Dacey, OT/L  [phone] 03/13/2017

## 2017-03-13 NOTE — Clinical Social Work Note (Signed)
Clinical Social Worker continuing to follow patient and family for support and discharge planning needs.  [facility] in hopes [facility] will extend LOG bed offer.  Patient daughter Nathaniel Murphy is primary decision maker and is also exploring facility options.  CSW remains available for support and to facilitate patient discharge needs.  Nathaniel GoldsJesse Anyeli Murphy, Kentucky[place]CSW [phone]

## 2017-03-14 ENCOUNTER — Inpatient Hospital Stay (HOSPITAL_COMMUNITY): Payer: Medicaid Other

## 2017-03-14 ENCOUNTER — Encounter: Payer: Self-pay | Admitting: General Surgery

## 2017-03-14 MED ORDER — CLONAZEPAM 0.5 MG PO TABS
0.5000 mg | ORAL_TABLET | Freq: Every day | ORAL | Status: DC
  Administered 2017-03-14 – 2017-03-25 (×12): 0.5 mg via ORAL
  Filled 2017-03-14 (×12): qty 1

## 2017-03-14 MED ORDER — POTASSIUM CHLORIDE CRYS ER 20 MEQ PO TBCR: 20.0000 meq | EXTENDED_RELEASE_TABLET | Freq: Three times a day (TID) | ORAL | Status: DC

## 2017-03-14 MED ORDER — METOPROLOL TARTRATE 50 MG PO TABS
100.0000 mg | ORAL_TABLET | Freq: Two times a day (BID) | ORAL | Status: DC
  Administered 2017-03-14 – 2017-03-17 (×7): 100 mg via ORAL
  Filled 2017-03-14 (×7): qty 2

## 2017-03-14 MED ORDER — POTASSIUM CHLORIDE 20 MEQ PO PACK
20.0000 meq | PACK | Freq: Three times a day (TID) | ORAL | Status: DC
  Administered 2017-03-14 – 2017-03-18 (×13): 20 meq via ORAL
  Filled 2017-03-14 (×18): qty 1
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
## 2017-03-14 NOTE — Progress Notes (Addendum)
Met with pt's ex-wife, per her request to discuss disability issues.  Per financial counseling notes, Medicaid application has been submitted and is pending.  Ex-wife states she needs a letter from physician stating that pt cannot work.  Notified PA; she will discuss with attending.  Ex-wife states she has no other questions for Case Manager at this time.    Reinaldo Raddle, RN, BSN  Trauma/Neuro ICU Case Manager [phone]
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
## 2017-03-14 NOTE — Plan of Care (Signed)
Pt working with PT and getting stronger daily.  Anxiety treated with scheduled clonazepam.  On RA.  Tolerating diet.  Incision healing without s/s of any infection.  Awaiting snf placement.

## 2017-03-14 NOTE — Plan of Care (Signed)
Pt progressing towards meeting goals.  

## 2017-03-15 LAB — BASIC METABOLIC PANEL
ANION GAP: 7 (ref 5–15)
BUN: 13 mg/dL (ref 6–20)
CALCIUM: 10.1 mg/dL (ref 8.9–10.3)
CO2: 27 mmol/L (ref 22–32)
Chloride: 101 mmol/L (ref 101–111)
Creatinine, Ser: 0.96 mg/dL (ref 0.61–1.24)
GFR calc Af Amer: >60 mL/min (ref >60)
GLUCOSE: 90 mg/dL (ref 65–99)
POTASSIUM: 4.4 mmol/L (ref 3.5–5.1)
SODIUM: 135 mmol/L (ref 135–145)

## 2017-03-15 LAB — CBC
HCT: 37.2 % — ABNORMAL LOW (ref 39.0–52.0)
HEMOGLOBIN: 12.8 g/dL — AB (ref 13.0–17.0)
MCH: 31.6 pg (ref 26.0–34.0)
MCHC: 34.4 g/dL (ref 30.0–36.0)
MCV: 91.9 fL (ref 78.0–100.0)
Platelets: 313 10*3/uL (ref 150–400)
RBC: 4.05 MIL/uL — ABNORMAL LOW (ref 4.22–5.81)
RDW: 13.4 % (ref 11.5–15.5)
WBC: 6.2 10*3/uL (ref 4.0–10.5)
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
## 2017-03-15 NOTE — Progress Notes (Signed)
BP 134/85   Pulse 63   Temp 98.2 F (36.8 C) (Oral)   Resp 12   Ht 5\' 11"  (1.803 m)   Wt 59.2 kg (130 lb 8 oz)   SpO2 100%   BMI 18.20 kg/m  Alert, not talking this am. Moves all extremities Pupils equal round and reactive Stable exam.  No new recs
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
## 2017-03-15 NOTE — Progress Notes (Signed)
1900: Pt oriented to self only. While unsure of time, location, or situation, he is in good spirits and making jokes with RNs, laughing, etc. He is not bothered by his confusion.  2300: Pt resting comfortably. No restlessness, no agitation, no anxiety observed.  0400: Pt continues to rest comfortably. Mild restlessness easily managed with verbal contacts and bed alarm.  0700: No acute events overnight. Handoff given to RN.
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
## 2017-03-17 NOTE — Clinical Social Work Note (Signed)
Clinical Social Worker continuing to follow patient and family for support and discharge planning needs.  In hopes a facility will extend LOG bed offer.  Patient daughter [name] is primary decision maker and is also exploring facility options.  CSW remains available for support and to facilitate patient discharge needs.  Nathaniel Murphy, [place] [phone]

## 2017-03-17 NOTE — Plan of Care (Signed)
  Progressing Activity: Ability to tolerate increased activity will improve 03/17/2017 0357 - Progressing by Lin LandsmanPuckett, Donalda Job, RN Note Patient tolerated sitting in chair for a couple hours in the beginning of the shift and didn't have too much difficulty getting back into the bed with the help of myself and another nurse. His gait is still unstable and needs help repositioning in bed.  Coping: Level of anxiety will decrease 03/17/2017 0357 - Progressing by Lin LandsmanPuckett, [place] Welliver, RN Note Patient woke up at approximately 0300 very confused and agitated. He really [facility] to [facility] and get some potato chips. We decided that a chicken parmesan lean cuisine would fit his diet better. Patient was confused as to why we could not go get him "real" chicken parmesan with "real" chicken and "real" parmesan. Patient is still disoriented to place, time and situation. In efforts to reorient him I felt I was causing him more distress.  Nutritional: Intake of prescribed amount of daily calories will improve 03/17/2017 0357 - Progressing by Lin LandsmanPuckett, Corliss Coggeshall, RN Note Patient has a good appetite and enjoys snacks.  Respiratory: Ability to maintain a clear airway and adequate ventilation will improve 03/17/2017 0357 - Progressing by Lin LandsmanPuckett, Ameliana Brashear, RN Role Relationship: Method of communication will improve 03/17/2017 0357 - Progressing by Lin LandsmanPuckett, Furkan Keenum, RN Note Patient is mostly able to communicate needs or we are able to infer what he needs by his agitation level (toileting, hunger).  Skin Integrity Impairment Risk: Risk for impaired skin integrity will decrease 03/17/2017 0357 - Progressing by Lin LandsmanPuckett, Sabreena Vogan, RN Safety: Ability to remain free from injury will improve 03/17/2017 0357 - Progressing by Lin LandsmanPuckett, Samella Lucchetti, RN Note Patient continues to wake up confused in the middle of the night, trying to leave to go to the [facility]. When I tell him [facility] can't [facility] because he's in the  hospital and healing (plus it's 3am), but the efforts to reorient patient causes more agitation so I try to divert his attention to sports on the TV or snacks.  Health Behavior/Discharge Planning: Ability to manage health-related needs will improve 03/17/2017 0357 - Progressing by Lin LandsmanPuckett, Carnel Stegman, RN Pain Managment: General experience of comfort will improve 03/17/2017 0357 - Progressing by Lin LandsmanPuckett, Carmaleta Youngers, RN Note Patient does not complain of any pain.  Physical Regulation: Ability to maintain clinical measurements within normal limits will improve 03/17/2017 0357 - Progressing by Lin LandsmanPuckett, Lliam Hoh, RN Nutrition: Adequate nutrition will be maintained 03/17/2017 0357 - Progressing by Lin LandsmanPuckett, Ramin Zoll, RN Bowel/Gastric: Will not experience complications related to bowel motility 03/17/2017 0357 - Progressing by Lin LandsmanPuckett, Munachimso Rigdon, RN
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
## 2017-03-17 NOTE — Progress Notes (Signed)
Trauma Service Note  Subjective: No distress.  In chair at the bedside.  Objective: Vital signs in last 24 hours: Temp:  [97.9 F (36.6 C)-99.2 F (37.3 C)] 98.1 F (36.7 C) (11/12 0800) Pulse Rate:  [73-92] 82 (11/11 2046) Resp:  [14-21] 18 (11/12 0800) BP: (93-145)/(75-95) 117/87 (11/12 0800) SpO2:  [100 %] 100 % (11/12 0800) Last BM Date: 03/16/17  Intake/Output from previous day: 11/11 0701 - 11/12 0700 In: 600 [P.O.:600] Out: 1600 [Urine:1600] Intake/Output this shift: No intake/output data recorded.  General: No acute distress  Lungs: Clear  Abd: Benign  Extremities: Intact  Neuro: Intact.  Improving.  Lab Results: CBC  Recent Labs    03/15/17 0358  WBC 6.2  HGB 12.8*  HCT 37.2*  PLT 313   BMET Recent Labs    03/15/17 0358  NA 135  K 4.4  CL 101  CO2 27  GLUCOSE 90  BUN 13  CREATININE 0.96  CALCIUM 10.1   PT/INR No results for input(s): LABPROT, INR in the last 72 hours. ABG No results for input(s): PHART, HCO3 in the last 72 hours.  Invalid input(s): PCO2, PO2  Studies/Results: No results found.  Anti-infectives: Anti-infectives (From admission, onward)   Start     Dose/Rate Route Frequency Ordered Stop   02/28/17 2200  vancomycin (VANCOCIN) IVPB 750 mg/150 ml premix  Status:  Discontinued     750 mg 150 mL/hr over 60 Minutes Intravenous Every 8 hours 02/28/17 1359 03/06/17 0852   02/28/17 1400  piperacillin-tazobactam (ZOSYN) IVPB 3.375 g  Status:  Discontinued     3.375 g 12.5 mL/hr over 240 Minutes Intravenous Every 8 hours 02/28/17 1307 03/06/17 0852   02/28/17 1400  vancomycin (VANCOCIN) 1,250 mg in sodium chloride 0.9 % 250 mL IVPB     1,250 mg 166.7 mL/hr over 90 Minutes Intravenous  Once 02/28/17 1325 02/28/17 1515      Assessment/Plan: s/p Procedure(s): CRANIOTOMY HEMATOMA EVACUATION SUBDURAL Disposition  LOS: 22 days   Marta LamasJames O. Gae BonWyatt, III, MD, FACS [phone] Trauma Surgeon 03/17/2017

## 2017-03-18 MED ORDER — METOPROLOL TARTRATE 50 MG PO TABS
50.0000 mg | ORAL_TABLET | Freq: Two times a day (BID) | ORAL | Status: DC
  Administered 2017-03-18 – 2017-03-26 (×17): 50 mg via ORAL
  Filled 2017-03-18 (×17): qty 1
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
## 2017-03-18 NOTE — Progress Notes (Signed)
Pt was eager to eat breakfast this morning, Got up and ambulated with PT for the very first time today around the unit. Is up sitting in the chair and feeding himself. Only complaint is that he does not have any meat.

## 2017-03-18 NOTE — Progress Notes (Signed)
As patient condition improves I have noticed a strong personality. I found it helpful during reassessments to sit down and talk to him for a better gauge of his alertness and mental status.  Sometimes he plays possum and he does not like to be corrected and may shutdown or answer questions "smartly".

## 2017-03-18 NOTE — Clinical Social Work Note (Signed)
Clinical Social Worker continuing to follow patient and family for support and discharge planning needs.  Patient remains a letter of guarantee placement - [facility] and await response.  CSW remains available for support and to facilitate patient discharge needs once bed available.  Macario GoldsJesse Lavada Langsam, [place] [phone]
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
## 2017-03-18 NOTE — NC FL2 (Signed)
[place] MEDICAID FL2 LEVEL OF CARE SCREENING TOOL     IDENTIFICATION  Patient Name: Nathaniel Murphy Birthdate: 01-13-1956 Sex: male Admission Date (Current Location): 02/23/2017  [facility] [place]   Facility and Address:  [facility], [address], [place], [place] [address]      Provider Number: [id number]  Attending Physician Name and Address:  Md, Trauma, MD  Relative Name and Phone Number:       Current Level of Care: Hospital Recommended Level of Care: Skilled Nursing Facility Prior Approval Number:    Date Approved/Denied:   PASRR Number: [id number]  Discharge Plan: SNF    Current Diagnoses: Patient Active Problem List   Diagnosis Date Noted  . Generalized anxiety disorder   . Chronic pain syndrome   . Tachypnea   . Benign essential HTN   . Hypokalemia   . Leukocytosis   . Acute blood loss anemia   . S/P craniotomy   . Fall down stairs 03/01/2017  . Closed extraarticular fracture of distal radius, left, initial encounter 03/01/2017  . Subdural hematoma (HCC) 02/23/2017  . Acute subdural hematoma (HCC) 02/23/2017    Orientation RESPIRATION BLADDER Height & Weight     Self, Place  Normal Incontinent, External catheter Weight: 139 lb 1.8 oz (63.1 kg) Height:  5\' 11"  (180.3 cm)  BEHAVIORAL SYMPTOMS/MOOD NEUROLOGICAL BOWEL NUTRITION STATUS      Incontinent Diet(Dysphagia 3 with Thin Liquids)  AMBULATORY STATUS COMMUNICATION OF NEEDS Skin   Extensive Assist Verbally Surgical wounds(Left Head Incision)                       Personal Care Assistance Level of Assistance  Bathing, Feeding, Dressing Bathing Assistance: Maximum assistance Feeding assistance: Limited assistance Dressing Assistance: Maximum assistance     Functional Limitations Info  Sight, Hearing, Speech Sight Info: Adequate Hearing Info: Adequate Speech Info: Adequate    SPECIAL CARE FACTORS FREQUENCY  PT (By licensed PT), OT  (By licensed OT), Speech therapy     PT Frequency: 5/wk OT Frequency: 5/wk     Speech Therapy Frequency: 2/wk      Contractures      Additional Factors Info  Code Status, Allergies Code Status Info: FULL Allergies Info: Influenza Vaccines           Current Medications (03/18/2017):  This is the current hospital active medication list Current Facility-Administered Medications  Medication Dose Route Frequency Provider Last Rate Last Dose  . acetaminophen (TYLENOL) tablet 650 mg  650 mg Oral Q6H PRN Axel Filleramirez, Armando, MD      . bisacodyl (DULCOLAX) suppository 10 mg  10 mg Rectal Daily PRN Coletta Memosabbell, Kyle, MD      . chlorhexidine (PERIDEX) 0.12 % solution 15 mL  15 mL Mouth Rinse BID Axel Filleramirez, Armando, MD   15 mL at 03/18/17 0942  . clonazePAM (KLONOPIN) tablet 0.5 mg  0.5 mg Oral QHS Meuth, Brooke A, PA-C   0.5 mg at 03/17/17 2204  . docusate (COLACE) 50 MG/5ML liquid 100 mg  100 mg Oral BID Jimmye NormanWyatt, James, MD   100 mg at 03/18/17 [id number]  . enoxaparin (LOVENOX) injection 40 mg  40 mg Subcutaneous Q24H Meuth, Brooke A, PA-C   40 mg at 03/18/17 1107  . feeding supplement (ENSURE ENLIVE) (ENSURE ENLIVE) liquid 237 mL  237 mL Oral BID BM Jimmye NormanWyatt, James, MD   237 mL at 03/18/17 0942  . folic acid (FOLVITE) tablet 1 mg  1 mg Oral Daily Jimmye NormanWyatt, James, MD   1 mg at 03/18/17 [id number]  . guaiFENesin (ROBITUSSIN) 100 MG/5ML solution 200 mg  10 mL Oral Q6H PRN Meuth, Brooke A, PA-C   200 mg at 03/10/17 1210  . hydrALAZINE (APRESOLINE) injection 10 mg  10 mg Intravenous Q4H PRN Violeta Gelinashompson, Burke, MD   10 mg at 03/05/17 1033  . hydrochlorothiazide 10 mg/mL oral suspension 13 mg  13 mg Oral Daily Jimmye NormanWyatt, James, MD   13 mg at 03/18/17 1236  . ibuprofen (ADVIL,MOTRIN) 100 MG/5ML suspension 400 mg  400 mg Oral Q6H PRN Violeta Gelinashompson, Burke, MD   400 mg at 03/16/17 1231  . ipratropium-albuterol (DUONEB) 0.5-2.5 (3) MG/3ML nebulizer solution 3 mL  3 mL Nebulization Q6H PRN Coralyn HellingSood, Vineet, MD   3 mL at 02/27/17 0053  .  labetalol (NORMODYNE,TRANDATE) injection 10-40 mg  10-40 mg Intravenous Q10 min PRN Coletta Memosabbell, Kyle, MD   20 mg at 03/05/17 1624  . levETIRAcetam (KEPPRA) 100 MG/ML solution 500 mg  500 mg Oral BID Violeta Gelinashompson, Burke, MD   500 mg at 03/18/17 1026  . LORazepam (ATIVAN) injection 1-2 mg  1-2 mg Intravenous Q1H PRN Alyson ReedyYacoub, Wesam G, MD   2 mg at 03/15/17 0205  . MEDLINE mouth rinse  15 mL Mouth Rinse q12n4p Axel Filleramirez, Armando, MD   15 mL at 03/18/17 1235  . metoprolol tartrate (LOPRESSOR) tablet 50 mg  50 mg Oral BID Violeta Gelinashompson, Burke, MD   50 mg at 03/18/17 1108  . multivitamin liquid 15 mL  15 mL Oral Daily Jimmye NormanWyatt, James, MD   15 mL at 03/18/17 0942  . naloxone [facility](NARCAN) injection 0.08 mg  0.08 mg Intravenous PRN Coletta Memosabbell, Kyle, MD      . ondansetron (ZOFRAN) injection 4 mg  4 mg Intravenous Q4H PRN Coletta Memosabbell, Kyle, MD      . pantoprazole sodium (PROTONIX) 40 mg/20 mL oral suspension 40 mg  40 mg Oral Daily Jimmye NormanWyatt, James, MD   40 mg at 03/18/17 0942  . pneumococcal 23 valent vaccine (PNU-IMMUNE) injection 0.5 mL  0.5 mL Intramuscular Tomorrow-1000 Violeta Gelinashompson, Burke, MD      . potassium chloride (KLOR-CON) packet 20 mEq  20 mEq Oral TID Joaquim Namowell, Lisa K, RPH   20 mEq at 03/18/17 1114  . promethazine (PHENERGAN) tablet 12.5-25 mg  12.5-25 mg Oral Q4H PRN Coletta Memosabbell, Kyle, MD      . senna-docusate (Senokot-S) tablet 1 tablet  1 tablet Oral QHS PRN Coletta Memosabbell, Kyle, MD      . sodium chloride flush (NS) 0.9 % injection 10-40 mL  10-40 mL Intracatheter Q12H Coletta Memosabbell, Kyle, MD   10 mL at 03/18/17 0944  . sodium chloride flush (NS) 0.9 % injection 10-40 mL  10-40 mL Intracatheter PRN Coletta Memosabbell, Kyle, MD      . thiamine (VITAMIN B-1) tablet 100 mg  100 mg Oral Daily Jimmye NormanWyatt, James, MD   100 mg at 03/18/17 0942  . traMADol (ULTRAM) tablet 50 mg  50 mg Oral Q6H PRN Jimmye NormanWyatt, James, MD   50 mg at 03/18/17 [id number]     Discharge Medications: Please see discharge summary for a list of discharge medications.  Relevant Imaging Results:  Relevant  Lab Results:   Additional Information SS#: [id number]  Macario GoldsJesse Tabias Swayze, LCSW [phone]

## 2017-03-19 MED ORDER — DOCUSATE SODIUM 100 MG PO CAPS
100.0000 mg | ORAL_CAPSULE | Freq: Two times a day (BID) | ORAL | Status: DC
  Administered 2017-03-19 – 2017-03-26 (×12): 100 mg via ORAL
  Filled 2017-03-19 (×14): qty 1

## 2017-03-19 MED ORDER — IBUPROFEN 200 MG PO TABS
400.0000 mg | ORAL_TABLET | Freq: Four times a day (QID) | ORAL | Status: DC | PRN
  Administered 2017-03-20 – 2017-03-21 (×2): 400 mg via ORAL
  Filled 2017-03-19 (×2): qty 2

## 2017-03-19 MED ORDER — POTASSIUM CHLORIDE CRYS ER 20 MEQ PO TBCR
20.0000 meq | EXTENDED_RELEASE_TABLET | Freq: Three times a day (TID) | ORAL | Status: DC
  Administered 2017-03-19 – 2017-03-26 (×20): 20 meq via ORAL
  Filled 2017-03-19 (×20): qty 1

## 2017-03-19 MED ORDER — HYDROCHLOROTHIAZIDE 12.5 MG PO CAPS
12.5000 mg | ORAL_CAPSULE | Freq: Every day | ORAL | Status: DC
  Administered 2017-03-20 – 2017-03-26 (×7): 12.5 mg via ORAL
  Filled 2017-03-19 (×7): qty 1

## 2017-03-19 MED ORDER — PANTOPRAZOLE SODIUM 40 MG PO TBEC
40.0000 mg | DELAYED_RELEASE_TABLET | Freq: Every day | ORAL | Status: DC
  Administered 2017-03-19 – 2017-03-25 (×7): 40 mg via ORAL
  Filled 2017-03-19 (×8): qty 1

## 2017-03-19 MED ORDER — PROSIGHT PO TABS
1.0000 | ORAL_TABLET | Freq: Every day | ORAL | Status: DC
  Administered 2017-03-19 – 2017-03-26 (×8): 1 via ORAL
  Filled 2017-03-19 (×8): qty 1

## 2017-03-19 NOTE — Progress Notes (Signed)
Trauma Service Note  Subjective: Patient is doing fine.  On the commode currently.  Objective: Vital signs in last 24 hours: Temp:  [97.4 F (36.3 C)-98.2 F (36.8 C)] 97.9 F (36.6 C) (11/14 0805) Pulse Rate:  [67-109] 69 (11/14 0636) Resp:  [10-22] 11 (11/14 0636) BP: (99-115)/(65-83) 110/70 (11/14 0636) SpO2:  [93 %-100 %] 96 % (11/14 0636) Weight:  [58.4 kg (128 lb 12.8 oz)] 58.4 kg (128 lb 12.8 oz) (11/14 0500) Last BM Date: 03/17/17  Intake/Output from previous day: 11/13 0701 - 11/14 0700 In: 300 [P.O.:300] Out: 1150 [Urine:1150] Intake/Output this shift: No intake/output data recorded.  General: No distress.  Doing well overall  Lungs: Clear  Abd: Benign  Extremities: Intact  Neuro: Intact.  A bit confused at times  Lab Results: CBC  No results for input(s): WBC, HGB, HCT, PLT in the last 72 hours. BMET No results for input(s): NA, K, CL, CO2, GLUCOSE, BUN, CREATININE, CALCIUM in the last 72 hours. PT/INR No results for input(s): LABPROT, INR in the last 72 hours. ABG No results for input(s): PHART, HCO3 in the last 72 hours.  Invalid input(s): PCO2, PO2  Studies/Results: No results found.  Anti-infectives: Anti-infectives (From admission, onward)   Start     Dose/Rate Route Frequency Ordered Stop   02/28/17 2200  vancomycin (VANCOCIN) IVPB 750 mg/150 ml premix  Status:  Discontinued     750 mg 150 mL/hr over 60 Minutes Intravenous Every 8 hours 02/28/17 1359 03/06/17 0852   02/28/17 1400  piperacillin-tazobactam (ZOSYN) IVPB 3.375 g  Status:  Discontinued     3.375 g 12.5 mL/hr over 240 Minutes Intravenous Every 8 hours 02/28/17 1307 03/06/17 0852   02/28/17 1400  vancomycin (VANCOCIN) 1,250 mg in sodium chloride 0.9 % 250 mL IVPB     1,250 mg 166.7 mL/hr over 90 Minutes Intravenous  Once 02/28/17 1325 02/28/17 1515      Assessment/Plan: s/p Procedure(s): CRANIOTOMY HEMATOMA EVACUATION SUBDURAL Disposition.  LOS: 24 days   Nathaniel LamasJames O.  Gae Murphy, III, MD, FACS [phone] Trauma Surgeon 03/19/2017

## 2017-03-19 NOTE — Progress Notes (Signed)
Occupational Therapy Treatment Patient Details Name: Nathaniel Murphy MRN: [id number] DOB: March 11, 1956 Today's Date: 03/19/2017    History of present illness pt is a 61 yo male with pmh significant for alcoholism, anxiety and seizures, admitted after a fall down the steps in which he suffered a C2 body and C5 facet fxs along with multiple facial fx's including R orbit, frontal bone, zygoma, maxillary sinusand and SDH. Extra-articular distal L radius fracture with small ulnar styloid fx.  Pt s/p emergent crani for evacuation of hematoma, sedated and intubated. Extubated 10/26.   OT comments  Excellent session today. Pt able to complete bathing and dressing with overall min A. Ambulated to toilet with min A. Pt confabulating, however demonstrates increased emergent level of awareness. Continue to recommend rehab at [facility]. Will continue to follow acutely to maximize functional level of independence.   Follow Up Recommendations  SNF;Supervision/Assistance - 24 hour    Equipment Recommendations  None recommended by OT    Recommendations for Other Services      Precautions / Restrictions Precautions Precautions: Fall;Cervical Required Braces or Orthoses: Cervical Brace Cervical Brace: At all times;Hard collar Other Brace/Splint: L wrist cock up splint Restrictions Weight Bearing Restrictions: Yes LUE Weight Bearing: Weight bear through elbow only       Mobility Bed Mobility               General bed mobility comments: OOB in chair  Transfers Overall transfer level: Needs assistance   Transfers: Sit to/from Stand Sit to Stand: Min assist              Balance     Sitting balance-Leahy Scale: Good       Standing balance-Leahy Scale: Fair Standing balance comment: able to stand at sink and complete pericare withou holding on                           ADL either performed or assessed with clinical judgement   ADL Overall ADL's : Needs  assistance/impaired     Grooming: Supervision/safety;Set up;Standing   Upper Body Bathing: Supervision/ safety;Set up;Sitting Upper Body Bathing Details (indicate cue type and reason): A to problem solve to put soap on cloth Lower Body Bathing: Min guard;Sit to/from stand   Upper Body Dressing : Minimal assistance   Lower Body Dressing: Minimal assistance;Sit to/from stand   Toilet Transfer: Minimal assistance;RW;Comfort height toilet;Ambulation   Toileting- Clothing Manipulation and Hygiene: Min guard       Functional mobility during ADLs: Minimal assistance;Rolling walker;Cueing for safety;Cueing for sequencing General ADL Comments: cues for problem solving     Vision       Perception     Praxis      Cognition Arousal/Alertness: Awake/alert Behavior During Therapy: Flat affect Overall Cognitive Status: Impaired/Different from baseline Area of Impairment: Orientation;Attention;Memory;Safety/judgement;Awareness;Problem solving               Rancho Levels of Cognitive Functioning Rancho Los Amigos Scales of Cognitive Functioning: Confused/appropriate Orientation Level: Disoriented to;Place;Time;Situation Current Attention Level: Sustained Memory: Decreased recall of precautions;Decreased short-term memory Following Commands: Follows one step commands consistently Safety/Judgement: Decreased awareness of safety;Decreased awareness of deficits Awareness: Emergent Problem Solving: Slow processing;Difficulty sequencing          Exercises     Shoulder Instructions       General Comments      Pertinent Vitals/ Pain       Pain Assessment: Faces Faces Pain Scale: Hurts  little more Pain Location: neck Pain Descriptors / Indicators: Discomfort;Aching Pain Intervention(s): Limited activity within patient's tolerance;Patient requesting pain meds-RN notified;RN gave pain meds during session  Home Living                                           Prior Functioning/Environment              Frequency  Min 2X/week        Progress Toward Goals  OT Goals(current goals can now be found in the care plan section)  Progress towards OT goals: Goals met and updated - see care plan  Acute Rehab OT Goals Patient Stated Goal: to get better OT Goal Formulation: Patient unable to participate in goal setting Time For Goal Achievement: 04/02/17 Potential to Achieve Goals: Good ADL Goals Pt Will Perform Grooming: with modified independence Pt Will Perform Upper Body Bathing: with modified independence Pt Will Perform Lower Body Bathing: with supervision Pt Will Perform Upper Body Dressing: with set-up;with supervision Pt Will Transfer to Toilet: with supervision;ambulating Pt Will Perform Toileting - Clothing Manipulation and hygiene: with modified independence Additional ADL Goal #3: Pt will demonstrate anticipatory awareness during ADL task with min vc  Plan Discharge plan remains appropriate    Co-evaluation                 AM-PAC PT "6 Clicks" Daily Activity     Outcome Measure   Help from another person eating meals?: A Little Help from another person taking care of personal grooming?: A Little Help from another person toileting, which includes using toliet, bedpan, or urinal?: A Little Help from another person bathing (including washing, rinsing, drying)?: A Little Help from another person to put on and taking off regular upper body clothing?: A Little Help from another person to put on and taking off regular lower body clothing?: A Little 6 Click Score: 18    End of Session Equipment Utilized During Treatment: Gait belt;Rolling walker;Cervical collar  OT Visit Diagnosis: Other abnormalities of gait and mobility (R26.89);Muscle weakness (generalized) (M62.81);Other symptoms and signs involving cognitive function   Activity Tolerance Patient tolerated treatment well   Patient Left in chair;with call  bell/phone within reach;with chair alarm set   Nurse Communication Mobility status        Time: 1020-1059 OT Time Calculation (min): 39 min  Charges: OT General Charges $OT Visit: 1 Visit OT Treatments $Self Care/Home Management : 38-52 mins  [facility], OT/L  [phone] 03/19/2017   Rachell Druckenmiller,HILLARY 03/19/2017, 11:15 AM

## 2017-03-19 NOTE — Plan of Care (Signed)
Patient alert to self, place and occasionally situation. Patient ambulated with a guarded gait in room using a front wheeled walker. Patient needed multiple verbal cues to be successful. Vital signs remained stable NAD noted throughout shift.

## 2017-03-20 NOTE — Clinical Social Work Note (Signed)
Clinical Social Worker continuing to follow patient and family for support and discharge planning needs.  CSW spoke with patient daughter today to provide updates on current bed search.  Facility liaison, Silvio PateShelia from [facility] came to visit with patient today and await possible bed offer - patient daughter updated.  CSW unable to locate LOG SNF bed at this time.  CSW remains available for support and to facilitate patient discharge needs once bed is available.  Macario GoldsJesse Zylan Almquist, [place] [phone]

## 2017-03-20 NOTE — Progress Notes (Signed)
  Speech Language Pathology Treatment: Dysphagia;Cognitive-Linquistic  Patient Details Name: Nathaniel Murphy MRN: [id number] DOB: 1956-04-14 Today's Date: 03/20/2017 Time: [id number] SLP Time Calculation (min) (ACUTE ONLY): 16 min  Assessment / Plan / Recommendation Clinical Impression  Pt was seen for skilled ST targeting cognitive and dysphagia goals.  Pt was pleasantly confused, needing mod-max question cues to reorient to place and situation.  His verbal output was perseverative around "taking medications."  Mod-max assist needed for redirection to topic at hand.  Pt consumed a functional snack of dys 3 textures and thin liquids with no overt s/s of aspiration with solids or liquids.  Pt's mastication was effective, leaving no residual solids in the oral cavity post swallow.  Pt was slightly impulsive with PO intake but this did not appear to greatly impact swallowing safety.  Pt needed max assist to utilize the call bell to request pain meds, stating that he had 10/10 neck pain.  Pt's overall presentation did not match his rating of pain and did not limit his participation in therapies.  RN made aware.  Pt was left in recliner with call bell within reach.  Continue per current plan of care.    HPI HPI: Nathaniel Murphy was found in bed incontinent of urine by his son at [facility]iel [facility] home. He had fallen down the stairs at home on 10/19 but refused to go to the hospital. CT showed large acute subdural hematoma on the left. Also has C2 fracture. Nor responsive      SLP Plan  Continue with current plan of care       Recommendations  Diet recommendations: Dysphagia 3 (mechanical soft);Thin liquid Liquids provided via: Straw Medication Administration: Crushed with puree Supervision: Intermittent supervision to cue for compensatory strategies Compensations: Minimize environmental distractions;Slow rate;Small sips/bites;Follow solids with liquid Postural Changes and/or Swallow Maneuvers: Seated  upright 90 degrees                Oral Care Recommendations: Oral care BID Follow up Recommendations: Skilled Nursing facility SLP Visit Diagnosis: Cognitive communication deficit (Y78.295(R41.841) Plan: Continue with current plan of care       GO                Nathaniel Murphy, Melanee SpryNicole L 03/20/2017, 4:12 PM

## 2017-03-20 NOTE — Progress Notes (Signed)
Physical Therapy Treatment Patient Details Name: Nathaniel Murphy MRN: [id number] DOB: December 10, 1955 Today's Date: 03/20/2017    History of Present Illness pt is a 61 yo male with pmh significant for alcoholism, anxiety and seizures, admitted after a fall down the steps in which he suffered a C2 body and C5 facet fxs along with multiple facial fx's including R orbit, frontal bone, zygoma, maxillary sinusand and SDH. Extra-articular distal L radius fracture with small ulnar styloid fx.  Pt s/p emergent crani for evacuation of hematoma, sedated and intubated. Extubated 10/26.    PT Comments    Pt pleasant and following single step commands throughout session but unable to recall precaution to not push with LUE. Nathaniel Murphy continues to demonstrate improved transfers, gait and balance with increased distance today and only 1 person assist. Pt remains limited by cognition and safety awareness. Will continue to follow.     Follow Up Recommendations  SNF;Supervision/Assistance - 24 hour     Equipment Recommendations       Recommendations for Other Services       Precautions / Restrictions Precautions Precautions: Fall;Cervical Required Braces or Orthoses: Cervical Brace Cervical Brace: At all times;Hard collar Other Brace/Splint: L wrist cock up splint Restrictions LUE Weight Bearing: Weight bear through elbow only    Mobility  Bed Mobility Overal bed mobility: Needs Assistance       Supine to sit: Min guard     General bed mobility comments: cues to not push with LUE, cues for initiation  Transfers Overall transfer level: Needs assistance   Transfers: Sit to/from Stand Sit to Stand: Min guard         General transfer comment: cues for safety and lack of LUE use for transfer  Ambulation/Gait Ambulation/Gait assistance: Min assist Ambulation Distance (Feet): 300 Feet Assistive device: 1 person hand held assist Gait Pattern/deviations: Step-through pattern;Decreased stride  length;Narrow base of support;Drifts right/left   Gait velocity interpretation: Below normal speed for age/gender General Gait Details: pt with RUE support throughout gait with directional cues, min assist for balance and cues to attend to task and maintain gait. Pt distracted by others in hallway and requires cues to attend to task   Stairs            Wheelchair Mobility    Modified Rankin (Stroke Patients Only) Modified Rankin (Stroke Patients Only) Pre-Morbid Rankin Score: No symptoms Modified Rankin: Moderately severe disability     Balance Overall balance assessment: Needs assistance   Sitting balance-Leahy Scale: Good       Standing balance-Leahy Scale: Fair                              Cognition Arousal/Alertness: Awake/alert Behavior During Therapy: WFL for tasks assessed/performed Overall Cognitive Status: Impaired/Different from baseline Area of Impairment: Orientation;Attention;Memory;Safety/judgement;Awareness;Problem solving                 Orientation Level: Disoriented to;Place;Time;Situation Current Attention Level: Selective Memory: Decreased short-term memory;Decreased recall of precautions Following Commands: Follows one step commands consistently Safety/Judgement: Decreased awareness of safety;Decreased awareness of deficits   Problem Solving: Decreased initiation;Requires verbal cues;Difficulty sequencing        Exercises General Exercises - Lower Extremity Long Arc Quad: AROM;Both;Seated;15 reps Hip Flexion/Marching: AROM;Both;Seated;15 reps    General Comments        Pertinent Vitals/Pain Pain Assessment: No/denies pain    Home Living  Prior Function            PT Goals (current goals can now be found in the care plan section) Progress towards PT goals: Progressing toward goals    Frequency           PT Plan Current plan remains appropriate    Co-evaluation               AM-PAC PT "6 Clicks" Daily Activity  Outcome Measure  Difficulty turning over in bed (including adjusting bedclothes, sheets and blankets)?: A Little Difficulty moving from lying on back to sitting on the side of the bed? : A Little Difficulty sitting down on and standing up from a chair with arms (e.g., wheelchair, bedside commode, etc,.)?: A Lot Help needed moving to and from a bed to chair (including a wheelchair)?: A Little Help needed walking in hospital room?: A Lot Help needed climbing 3-5 steps with a railing? : A Lot 6 Click Score: 15    End of Session Equipment Utilized During Treatment: Gait belt;Cervical collar Activity Tolerance: Patient tolerated treatment well Patient left: in chair;with chair alarm set;with call bell/phone within reach Nurse Communication: Mobility status;Precautions;Weight bearing status PT Visit Diagnosis: Unsteadiness on feet (R26.81);Other symptoms and signs involving the nervous system (R29.898);Other abnormalities of gait and mobility (R26.89)     Time: 1114-1138 PT Time Calculation (min) ([id number] ONLY): 24 min  Charges:  $Gait Training: 8-22 mins $Therapeutic Exercise: 8-22 mins                    G Codes:       Delaney MeigsMaija Tabor Laura-Lee Villegas, PT [phone]    Enedina FinnerMaija B Maxcine Strong 03/20/2017, 12:37 PM
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
## 2017-03-21 ENCOUNTER — Inpatient Hospital Stay (HOSPITAL_COMMUNITY): Payer: Medicaid Other
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
## 2017-03-21 NOTE — Progress Notes (Signed)
The patient was sitting in his chair watching television this morning around 1035. About 20 minutes prior to the event, the RN and NT had rounded on the patient and ensured the chair alarm was on, and the patient was reminded to call using the call bell located in front of him. The patient then called out using his call bell, according to the nurse secretary (NS), the patient then started to get up out of his chair. When the NS answered his call. The NS stated to stay in bed and the NT heard the alarm and ran to the room. As the NT was arriving in the room, the patient was hitting the floor. It was stated that he fell onto his right arm and possibly could of hit his head. The patient stated that he did not hit his head, but hit his chin instead. However, the patient is confused and not able to give a full recount of the event. Immediately after falling, the patient was fully assessed by his RN and the AD of 4N PCU. No neuro deficits or changes from baseline assessment were noted. No trauma to the patient's head, neck or arm was appreciated. Attending MD notified. No new orders received. Will continue to monitor the patient closely. Post fall huddle and flow sheet completed.

## 2017-03-21 NOTE — Progress Notes (Signed)
1900: Pt assessed for night shift. Denies headache or any pain. Remains pleasantly confused, oriented only to self. No telesitter in place. Called for camera  2000: Called report to tele-sitter, Alona BeneJoyce. Pt monitoring in place. Pt educated on purpose of camera.  0000: Pt resting comfortably. Denies headache, discomfort, and pain.  0200: Pt removed C-collar and wrist splint, c/o pain, discomfort. Refused assistance to replace devices, stating he does not need them anymore. Provider paged. Will attempt redirection hourly.  0300Lindie Spruce: Wyatt, MD (trauma) aware of pt refusal of ortho devices. No new orders.  0400: Pt c/o neck pain while rolling for a bath and linen change. C-collar reapplied. LUE splint remains off.  0700: Pt resting comfortably. Handoff report given to RN.

## 2017-03-22 MED ORDER — POTASSIUM CHLORIDE CRYS ER 10 MEQ PO TBCR
EXTENDED_RELEASE_TABLET | ORAL | Status: AC
  Filled 2017-03-22: qty 1

## 2017-03-22 NOTE — Clinical Social Work Note (Signed)
Clinical Social Worker continuing to follow patient and family for support and discharge planning needs.  Still no bed offers at this time.  Awaiting a couple of different leads from potential LOG facilities.  CSW remains available for support and to facilitate patient discharge needs once bed becomes available.  Macario GoldsJesse Tynesha Free, [place] [phone]
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
## 2017-03-23 DIAGNOSIS — L8990 Pressure ulcer of unspecified site, unspecified stage: Secondary | ICD-10-CM

## 2017-03-23 NOTE — Progress Notes (Signed)
1900: Pt removed c-collar. Refusing to put back on. Will address with hourly rounding.  2200: C-collar reapplied and later removed by pt.  0100: Pt c/o headache. C-collar reapplied and pt medicated.  0400: Pt resting comfortably.  0700: Handoff report given to RN.

## 2017-03-23 NOTE — Progress Notes (Signed)
Trauma Service Note  Subjective: Patient awake and answering questions appropriately.  No acute distress.  Objective: Vital signs in last 24 hours: Temp:  [97.4 F (36.3 C)-98.9 F (37.2 C)] 97.8 F (36.6 C) (11/18 0800) Pulse Rate:  [70-117] 70 (11/18 0800) Resp:  [15-21] 18 (11/18 0800) BP: (101-128)/(72-108) 109/72 (11/18 0800) SpO2:  [95 %-100 %] 95 % (11/18 0800) Weight:  [58.6 kg (129 lb 3.2 oz)] 58.6 kg (129 lb 3.2 oz) (11/18 0429) Last BM Date: 03/21/17  Intake/Output from previous day: 11/17 0701 - 11/18 0700 In: 1180 [P.O.:1180] Out: 475 [Urine:475] Intake/Output this shift: Total I/O In: -  Out: 200 [Urine:200]  General: Has pressure sore in the mid-occipital area, 2 cm without drainage.  Lungs: Clear  Abd: Benign  Extremities: No changes  Neuro: Intact  Lab Results: CBC  No results for input(s): WBC, HGB, HCT, PLT in the last 72 hours. BMET No results for input(s): NA, K, CL, CO2, GLUCOSE, BUN, CREATININE, CALCIUM in the last 72 hours. PT/INR No results for input(s): LABPROT, INR in the last 72 hours. ABG No results for input(s): PHART, HCO3 in the last 72 hours.  Invalid input(s): PCO2, PO2  Studies/Results: Ct Head Wo Contrast  Result Date: 03/21/2017 CLINICAL DATA:  Altered mental status.  Headache.  Fell. EXAM: CT HEAD WITHOUT CONTRAST TECHNIQUE: Contiguous axial images were obtained from the base of the skull through the vertex without intravenous contrast. COMPARISON:  02/27/2017 and 02/25/2017. FINDINGS: Brain: Interval decrease in density of the left lateral extra-axial fluid collection with mild residual higher density component posteriorly, improved. This collection measures 13 mm in maximum thickness, previously 18 mm. The previously demonstrated intracranial air has resolved. There is 3 mm of midline shift to the right, 4 mm on 02/25/2017. Resolved right frontal lobe hemorrhage and intraventricular hemorrhage. No new hemorrhage  demonstrated. Diffusely enlarged ventricles and subarachnoid spaces. Patchy white matter low density in both cerebral hemispheres. No CT evidence of acute infarction. Vascular: No hyperdense vessel or unexpected calcification. Skull: Post craniotomy changes are again demonstrated on the left. Stable nondisplaced right orbital roof fracture. This continues to involve the inferior aspect of the right frontal bone. A left frontal burr hole is again demonstrated. Sinuses/Orbits: Small amount of bilateral maxillary sinus mucosal thickening with improvement. Other: None. IMPRESSION: 1. No acute abnormality. 2. Decrease in size and density of the previously demonstrated left lateral extra-axial hematoma. 3. No acute hemorrhage visualized. 4. Decreased left-to-right shift, currently 3 mm. 5. Stable atrophy and chronic small vessel white matter ischemic changes. 6. Minimal residual chronic bilateral maxillary sinusitis. Electronically Signed   By: Beckie SaltsSteven  Reid M.D.   On: 03/21/2017 20:34    Anti-infectives: Anti-infectives (From admission, onward)   Start     Dose/Rate Route Frequency Ordered Stop   02/28/17 2200  vancomycin (VANCOCIN) IVPB 750 mg/150 ml premix  Status:  Discontinued     750 mg 150 mL/hr over 60 Minutes Intravenous Every 8 hours 02/28/17 1359 03/06/17 0852   02/28/17 1400  piperacillin-tazobactam (ZOSYN) IVPB 3.375 g  Status:  Discontinued     3.375 g 12.5 mL/hr over 240 Minutes Intravenous Every 8 hours 02/28/17 1307 03/06/17 0852   02/28/17 1400  vancomycin (VANCOCIN) 1,250 mg in sodium chloride 0.9 % 250 mL IVPB     1,250 mg 166.7 mL/hr over 90 Minutes Intravenous  Once 02/28/17 1325 02/28/17 1515      Assessment/Plan: s/p Procedure(s): CRANIOTOMY HEMATOMA EVACUATION SUBDURAL Disposition  Not sure how much longer he  has to wear the collar, but we have had problems with pressure sores with the [place] lately and may need to be addressed globally./  LOS: 28 days   Marta LamasJames O. Gae BonWyatt,  III, MD, FACS [phone] Trauma Surgeon 03/23/2017

## 2017-03-24 MED ORDER — PRO-STAT SUGAR FREE PO LIQD
30.0000 mL | Freq: Every day | ORAL | Status: DC
  Administered 2017-03-24 – 2017-03-26 (×3): 30 mL via ORAL
  Filled 2017-03-24 (×3): qty 30

## 2017-03-24 MED ORDER — ENSURE ENLIVE PO LIQD
237.0000 mL | Freq: Three times a day (TID) | ORAL | Status: DC
  Administered 2017-03-24 – 2017-03-26 (×5): 237 mL via ORAL

## 2017-03-24 NOTE — Progress Notes (Addendum)
Pt still has no bed offers as of today. [facility]. At this time CSW has left VM asking that facilities contact CSW back. CSW remains available for discharge needs as well as support. CSW has also left a VM updating pt's daughter [name] of no bed offers at this time.     Claude MangesKierra S. Gawain Crombie, MSW, LCSW-A Emergency Department Clinical Social Worker [phone]
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
## 2017-03-24 NOTE — Progress Notes (Signed)
Patient continuously takes aspen collar off, pt educated and oriented with best attempts to put back on with some success.

## 2017-03-25 DIAGNOSIS — R569 Unspecified convulsions: Secondary | ICD-10-CM

## 2017-03-25 MED ORDER — METOPROLOL TARTRATE 50 MG PO TABS: 50.0000 mg | ORAL_TABLET | Freq: Two times a day (BID) | ORAL | Status: AC

## 2017-03-25 MED ORDER — PANTOPRAZOLE SODIUM 40 MG PO TBEC: 40.0000 mg | DELAYED_RELEASE_TABLET | Freq: Every day | ORAL | Status: AC

## 2017-03-25 MED ORDER — CLONAZEPAM 0.5 MG PO TABS
0.5000 mg | ORAL_TABLET | Freq: Every day | ORAL | 0 refills | Status: AC
Start: 2017-03-25 — End: ?

## 2017-03-25 MED ORDER — ACETAMINOPHEN 325 MG PO TABS: 650.0000 mg | ORAL_TABLET | Freq: Four times a day (QID) | ORAL | Status: AC | PRN

## 2017-03-25 MED ORDER — ENSURE ENLIVE PO LIQD: 237.0000 mL | Freq: Three times a day (TID) | ORAL | 12 refills | Status: AC

## 2017-03-25 MED ORDER — TRAMADOL HCL 50 MG PO TABS: 50.0000 mg | ORAL_TABLET | Freq: Four times a day (QID) | ORAL | 0 refills | Status: AC | PRN

## 2017-03-25 MED ORDER — HYDROCHLOROTHIAZIDE 12.5 MG PO CAPS: 12.5000 mg | ORAL_CAPSULE | Freq: Every day | ORAL | Status: AC

## 2017-03-25 MED ORDER — FOLIC ACID 1 MG PO TABS: 1.0000 mg | ORAL_TABLET | Freq: Every day | ORAL | Status: AC

## 2017-03-25 MED ORDER — DOCUSATE SODIUM 100 MG PO CAPS
100.0000 mg | ORAL_CAPSULE | Freq: Two times a day (BID) | ORAL | 0 refills | Status: AC
Start: 2017-03-25 — End: ?

## 2017-03-25 MED ORDER — POTASSIUM CHLORIDE CRYS ER 20 MEQ PO TBCR
20.0000 meq | EXTENDED_RELEASE_TABLET | Freq: Three times a day (TID) | ORAL | Status: AC
Start: 2017-03-25 — End: ?

## 2017-03-25 MED ORDER — THIAMINE HCL 100 MG PO TABS: 100.0000 mg | ORAL_TABLET | Freq: Every day | ORAL | Status: AC

## 2017-03-25 NOTE — Progress Notes (Signed)
CSW spoke with [facility] and was informed that facility never received any information on pt. At [facility]  [facility]. Marylene Landngela to follow up with CSW for decision.     Claude MangesKierra S. Janaisa Birkland, MSW, LCSW-A Emergency Department Clinical Social Worker [phone]

## 2017-03-25 NOTE — Clinical Social Work Note (Signed)
Clinical Social Worker continuing to follow patient and family for support and discharge planning needs.  [name] has made a bed offer.  CSW updated patient daughter, Nathaniel Murphy over the phone, who is unhappy with long distance placement but verbalized understanding.  CSW updated PA - patient will have to discharge tomorrow since he has to be sitter free for 24 hours.  CSW remains available for support and to facilitate patient discharge needs once medically stable.  [name], [place] [phone]
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
## 2017-03-25 NOTE — Progress Notes (Addendum)
1900: Received report from RN. Pt resting in chair without c-collar or wrist splint. No complaints of pain.  2200: C/o head and neck pain "12/10" during med pass. C-collar offered and pt educated. Refused reapplication.  0000: Pt sleeping, reclined in chair. Pt has been much less agitated this evening, requiring no telesitter interventions, fewer alarms interrupting rest, etc.  0400: Pt casually removed BP cuff, cardiac leads, and pulse ox probe. He neatly wrapped them up and set them aside. When asked why, he stated that he no longer needs them. Pt was encouraged to allow monitors to be reapplied, but politely refused. CCMD aware. On-call trauma MD made aware of refusal. Verbal okay to d/c tele.  0600: Pt continues resting comfortably in chair. C-collar and wrist splint remained off all night. Still refusing continuous monitoring.  0700: Handoff report given to RN. No acute events overnight.

## 2017-03-25 NOTE — Discharge Instructions (Signed)
C2 body and C5 facet FX- continue C collar until follow up with Dr. Mikal Planeabell Facial fractures- follow up with Dr. Lazarus SalinesWolicki  L wrist FX- NWB LUE insplint - follow up with Dr. Jena GaussHaddix Diet: Dysphagia 3 with thin liquids  Can follow up with Trauma clinic as needed. Call with questions or concerns.    Traumatic Brain Injury Traumatic brain injury (TBI) is an injury to your brain from a blow to your head (closed injury) or an object penetrating your skull and entering your brain (open injury). The severity of TBI varies significantly from one person to the next. Some TBIs cause you to pass out (lose consciousness) immediately and for a long period of time. Other TBIs do not cause any loss of consciousness. Symptoms of any type of TBI can be long lasting (chronic). TBI can interfere with memory and speech. TBI can also cause chronic symptoms like headache or dizziness. What are the causes? TBI is caused by a closed or open injury. What increases the risk? You may be at higher risk for TBI if you:  Are 75 or older.  Are a man.  Are in a car accident.  Play a contact sport, especially football, hockey, or soccer.  Do not wear protective gear while playing sports.  Are in the [employer].  Are a victim of violence.  Abuse drugs or alcohol.  Have had a previous TBI.  What are the signs or symptoms? Signs and symptoms of TBI may occur right away or not until days, weeks, or months after the injury. They may last for days, weeks, months, or years. Symptoms may include:  Loss of consciousness.  Headache.  Confusion.  Fatigue.  Changes in sleep.  Dizziness.  Mood or personality changes.  Memory problems.  Nausea or vomiting or both.  Seizures.  Clumsiness.  Slurred speech.  Depression and anxiety.  Anger.  Inability to control emotions or actions (impulse control).  Loss of or dulling of your senses, such as hearing, vision, and touch. This can include: ? Blurred  vision. ? Ringing in your ears.  How is this diagnosed? TBI may be diagnosed by a medical history and physical exam. Your health care provider will also do a neurologic exam to check your:  Reflexes.  Sensations.  Alertness.  Memory.  Vision.  Hearing.  Coordination.  Your health care provider will also do tests to diagnose the extent of your TBI, such as a CT scan of your brain and skull. One way to determine the severity of your TBI is with a scoring system called the Glasgow Coma Scale (GCS). It measures eye opening, motor response, and verbal response. The higher the score, the milder the TBI. Your TBI may be described as mild, moderate, or severe:  Mild TBI (concussion). ? Symptoms of mild TBI usually go away on their own. This can take weeks or months, depending on the type of concussion. ? Your GCS will be 13-15. ? Your brain CT scan will be normal. ? You may or may not have a short hospital stay.  Moderate TBI. ? Your GCS will be 9-12. ? Your brain CT scan will be abnormal. ? You will likely need a short hospital stay.  Severe TBI. ? Your GCS will be 3-8. ? Your brain CT scan will be abnormal. ? You may need a long stay in the hospital.  How is this treated? Emergency treatment of TBI may involve measures to maintain a clear airway and stable blood pressure. Brain surgery  may be needed to:  Remove a blood clot.  Repair bleeding.  Remove an object that has penetrated the brain, such as a skull fragment or a bullet.  Other treatments depend on your chronic signs and symptoms. These treatments include the following types of therapy:  Physical.  Occupational.  Speech and language.  Mental health.  Social support.  Follow these instructions at home:  Carefully follow all your health care provider's instructions.  Work closely with all your therapists, if necessary.  Take medicines only as directed by your health care provider. Do not take aspirin  or other anti-inflammatory medicines such as ibuprofen or naproxen unless approved by your health care provider.  Do not abuse illegal drugs.  Limit alcohol intake to no more than 1 drink per day for nonpregnant women and 2 drinks per day for men. One drink equals 12 ounces of beer, 5 ounces of wine, or 1 ounces of hard liquor.  Avoid any situation where there is potential for another head injury, such as football, hockey, soccer, basketball, martial arts, downhill snow sports, and horseback riding. Do not do these activities until your health care provider approves.  Rest. Rest helps the brain to heal. Make sure you: ? Get plenty of sleep at night. Avoid staying up late at night. ? Keep the same bedtime hours on weekends and weekdays. ? Rest during the day. Take daytime naps or rest breaks when you feel tired.  Avoid excessive visual stimulation while recovering from a TBI. This includes work on the computer, watching TV, and reading.  Try to avoid activities that cause physical or mental stress. Stay home from work or school as directed by your health care provider.  Make lists to plan your day and help your memory.  Do not drive, ride a bicycle, or operate heavy machinery until your health care provider approves.  Seek support from friends and family.  Keep all follow-up visits as directed by your health care provider. This is important.  Watch your symptoms and tell others to do the same. Complications sometimes occur after a TBI. How is this prevented?  Wear a helmet while biking, skiing, skateboarding, skating, or doing similar activities. Wear your seatbelt while driving.  Do not abuse alcohol or drugs.  Do not drink and drive.  Prevent falls at home by: ? Removing clutter and tripping hazards, including loose rugs, from floors and stairways. ? Using grab bars in bathrooms and handrails by stairs. ? Placing nonslip mats on floors and in bathtubs. ? Improving lighting in  dim areas. Contact a health care provider if: Seek medical care if you have any of the following symptoms for more than two weeks after your injury:  Chronic headaches.  Dizziness or balance problems.  Nausea.  Vision problems.  Increased sensitivity to noise or light.  Depression or mood swings.  Anxiety or irritability.  Memory problems.  Difficulty concentrating or paying attention.  Sleep problems.  Feeling tired all the time.  Get help right away if:  You have confusion or unusual drowsiness.  It is difficult to wake you up.  You have nausea or persistent, forceful vomiting.  You feel like you are moving when you are not (vertigo). Your eyes may move rapidly back and forth.  You have convulsions or faint.  You have severe, persistent headaches that are not relieved by medicine.  You cannot use your arms or legs normally.  One of your pupils is larger than the other.  You have  clear or bloody discharge from your nose or ears.  Your problems are getting worse, not better. This information is not intended to replace advice given to you by your health care provider. Make sure you discuss any questions you have with your health care provider. Document Released: 04/12/2002 Document Revised: 12/24/2015 Document Reviewed: 08/05/2013 Elsevier Interactive Patient Education  2017 Elsevier Inc.   Cast or Splint Care, Adult Casts and splints are supports that are worn to protect broken bones and other injuries. A cast or splint may hold a bone still and in the correct position while it heals. Casts and splints may also help ease pain, swelling, and muscle spasms. A cast is a hardened support that is usually made of fiberglass or plaster. It is custom-fit to the body and it offers more protection than a splint. It cannot be taken off and put back on. A splint is a type of soft support that is usually made from cloth and elastic. It can be adjusted or taken off as  needed. You may need a cast or a splint if you:  Have a broken bone.  Have a soft-tissue injury.  Need to keep an injured body part from moving (keep it immobile) after surgery.  How is this treated? If you have a cast:  Do not stick anything inside the cast to scratch your skin. Sticking something in the cast increases your risk of infection.  Check the skin around the cast every day. Tell your health care provider about any concerns.  You may put lotion on dry skin around the edges of the cast. Do not put lotion on the skin underneath the cast.  Keep the cast clean.  If the cast is not waterproof: ? Do not let it get wet. ? Cover it with a watertight covering when you take a bath or a shower. If you have a splint:  Wear it as told by your health care provider. Remove it only as told by your health care provider.  Loosen the splint if your fingers or toes tingle, become numb, or turn cold and blue.  Keep the splint clean.  If the splint is not waterproof: ? Do not let it get wet. ? Cover it with a watertight covering when you take a bath or a shower. Bathing  Do not take baths or swim until your health care provider approves. Ask your health care provider if you can take showers. You may only be allowed to take sponge baths for bathing.  If your cast or splint is not waterproof, cover it with a watertight covering when you take a bath or shower. Managing pain, stiffness, and swelling  Move your fingers or toes often to avoid stiffness and to lessen swelling.  Raise (elevate) the injured area above the level of your heart while sitting or lying down. Safety  Do not use the injured limb to support your body weight until your health care provider says that it is okay.  Use crutches or other assistive devices as told by your health care provider. General instructions  Do not put pressure on any part of the cast or splint until it is fully hardened. This may take  several hours.  Return to your normal activities as told by your health care provider. Ask your health care provider what activities are safe for you.  Take over-the-counter and prescription medicines only as told by your health care provider.  Keep all follow-up visits as told by your health care  provider. This is important. Contact a health care provider if:  Your cast or splint gets damaged.  The skin around the cast gets red or raw.  The skin under the cast is extremely itchy or painful.  Your cast or splint feels very uncomfortable.  Your cast or splint is too tight or too loose.  Your cast becomes wet or it develops a soft spot or area.  You get an object stuck under your cast. Get help right away if:  Your pain is getting worse.  The injured area tingles, becomes numb, or turns cold and blue.  The part of your body above or below the cast is swollen and discolored.  You cannot feel or move your fingers or toes.  There is fluid leaking through the cast.  You have severe pain or pressure under the cast.  You have trouble breathing.  You have shortness of breath.  You have chest pain. This information is not intended to replace advice given to you by your health care provider. Make sure you discuss any questions you have with your health care provider. Document Released: 04/19/2000 Document Revised: 11/11/2015 Document Reviewed: 10/14/2015 Elsevier Interactive Patient Education  [employer].

## 2017-03-25 NOTE — Progress Notes (Signed)
Consulted ortho tech to fit for aspen collar, tech states that is out of her scope and will contact and outside source to fit for new c-collar.  Patient continues to remove collar, and complains of pain when it is off.  RN continues to reapply collar and educate patient.
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
## 2017-03-25 NOTE — Progress Notes (Signed)
Orthopedic Tech Progress Note Patient Details:  Nathaniel Murphy Dicenso 10-05-1955 [id number]  Patient ID: Nathaniel Murphy Pendergraft, male   DOB: 10-05-1955, 61 y.o.   MRN: [id number]   Saul FordyceJennifer C Andreya Lacks 03/25/2017, 2:13 PMCalled [employer] for Collar.

## 2017-03-25 NOTE — Progress Notes (Signed)
CSW updated LCSW Verdon CumminsJesse of gathered information on pt at this time. CSW provided LCSW with contact information [facility]. At this time this CSW signing off.     Claude MangesKierra S. Arrow Tomko, MSW, LCSW-A Emergency Department Clinical Social Worker [phone]

## 2017-03-26 NOTE — Clinical Social Work Note (Signed)
Clinical Social Worker facilitated patient discharge including contacting patient family and facility to confirm patient discharge plans.  Clinical information faxed to facility and family agreeable with plan.  CSW arranged [facility] transport [facility].  RN to call report prior to discharge.  Clinical Social Worker will sign off for now as social work intervention is no longer needed. Please consult us again if new need arises.  Macario GoldsJesse Suresh Audi, Kentucky[place]CS[place] [phone]
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
## 2017-03-26 NOTE — Clinical Social Work Placement (Signed)
   CLINICAL SOCIAL WORK PLACEMENT  NOTE  Date:  03/26/2017  Patient Details  Name: Nathaniel Murphy MRN: [id number] Date of Birth: 05-14-1955  Clinical Social Work is seeking post-discharge placement for this patient at the Skilled  Nursing Facility level of care (*CSW will initial, date and re-position this form in  chart as items are completed):  Yes   Patient/family provided with [facility] Social Work Department's list of facilities offering this level of care within the geographic area requested by the patient (or if unable, by the patient's family).  Yes   Patient/family informed of their freedom to choose among providers that offer the needed level of care, that participate in Medicare, Medicaid or managed care program needed by the patient, have an available bed and are willing to accept the patient.  Yes   Patient/family informed of [facility]'s ownership interest in [facility] and [facility], as well as of the fact that they are under no obligation to receive care at these facilities.  PASRR submitted to [employer] on 03/18/17     PASRR number received on 03/18/17     Existing PASRR number confirmed on       FL2 transmitted to all facilities in geographic area requested by pt/family on 03/18/17     FL2 transmitted to all facilities within larger geographic area on       Patient informed that his/her managed care company has contracts with or will negotiate with certain facilities, including the following:        Yes   Patient/family informed of bed offers received.  Patient chooses bed at [facility]     Physician recommends and patient chooses bed at      Patient to be transferred to [facility] on 03/26/17.  Patient to be transferred to facility by Ambulance     Patient family notified on 03/26/17 of transfer.  Name of family member notified:  Patient daughter [name] over the phone     PHYSICIAN Please  prepare priority discharge summary, including medications     Additional Comment:   Macario GoldsJesse Milburn Freeney, LCSW [phone]

## 2019-02-10 IMAGING — CT CT CERVICAL SPINE W/O CM
5 of 8 series · 11 of 33 positions shown, 12 images · non-contrast
Comparison: None.

CLINICAL DATA: 60 y/o  M; status post fall.

EXAM:
CT HEAD WITHOUT CONTRAST
CT CERVICAL SPINE WITHOUT CONTRAST
TECHNIQUE: Multidetector CT imaging of the head and cervical spine was
performed following the standard protocol without intravenous
contrast. Multiplanar CT image reconstructions of the cervical spine
were also generated.

[Series 4: head bone · axial · 0.45mm/px · z∈[-82,-26]mm · 2 of 85 slices shown]
[im 29/85  bone]
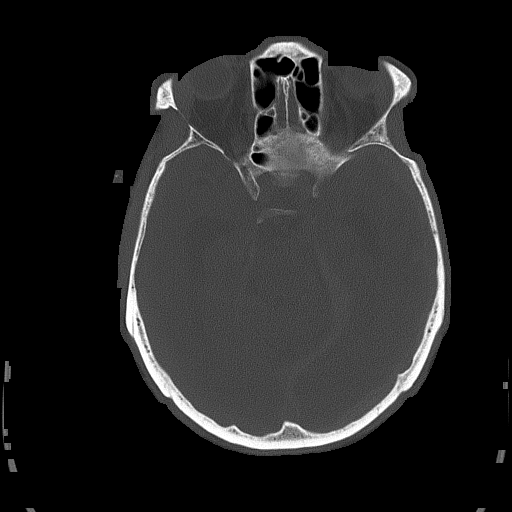
[im 57/85  bone]
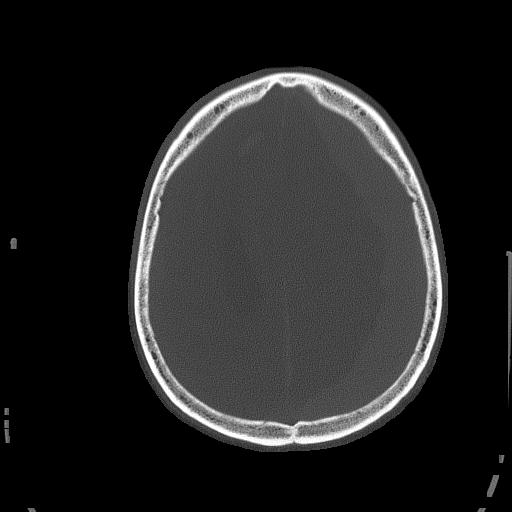

[Series 7: c_spine 2.0 st · axial · 0.30mm/px · z∈[-228,-164]mm · 2 of 98 slices shown, 3 images]
[im 33/98  soft-tissue]
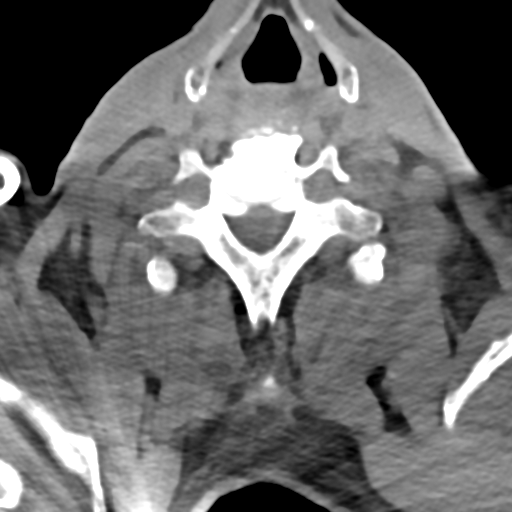
[im 33/98  bone]
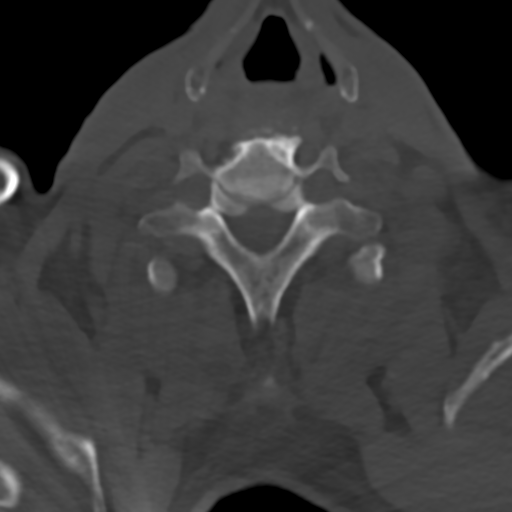
[im 65/98  bone]
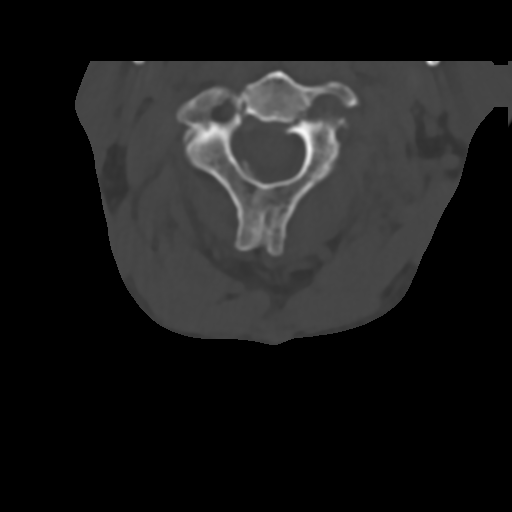

[Series 10: c_spine 2.0 sag bone · sagittal · 0.25mm/px · 4 of 61 slices shown]
[im 13/61  bone]
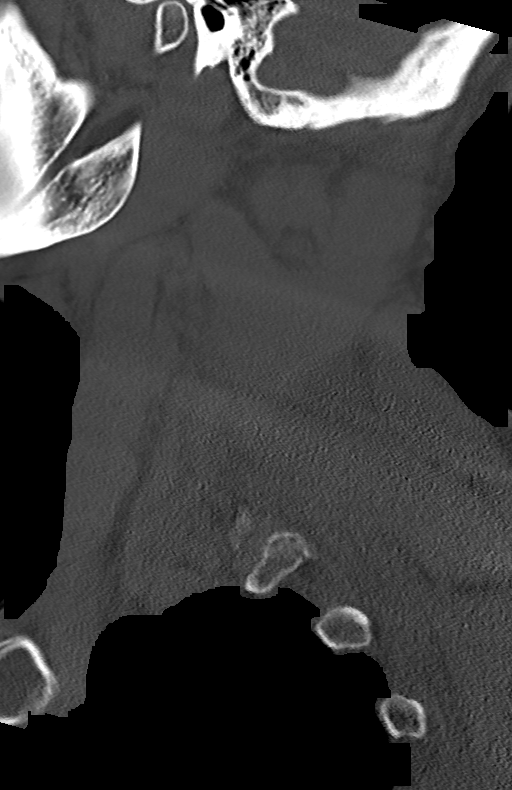
[im 25/61  bone]
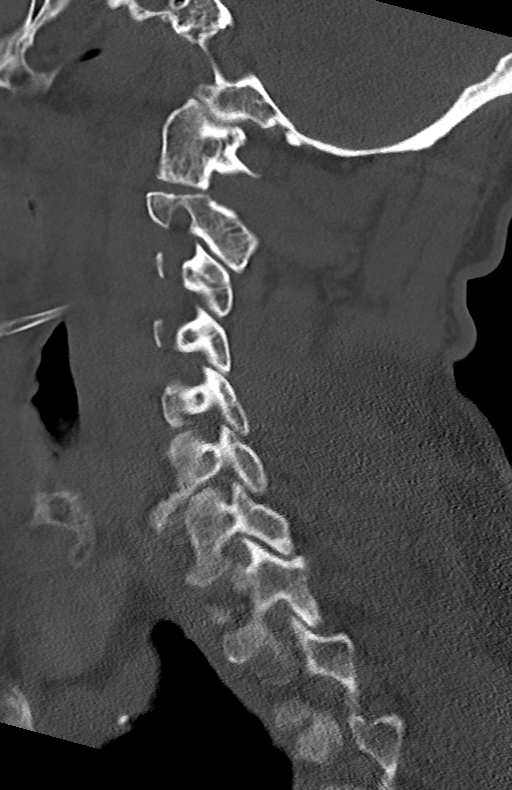
[im 37/61  bone]
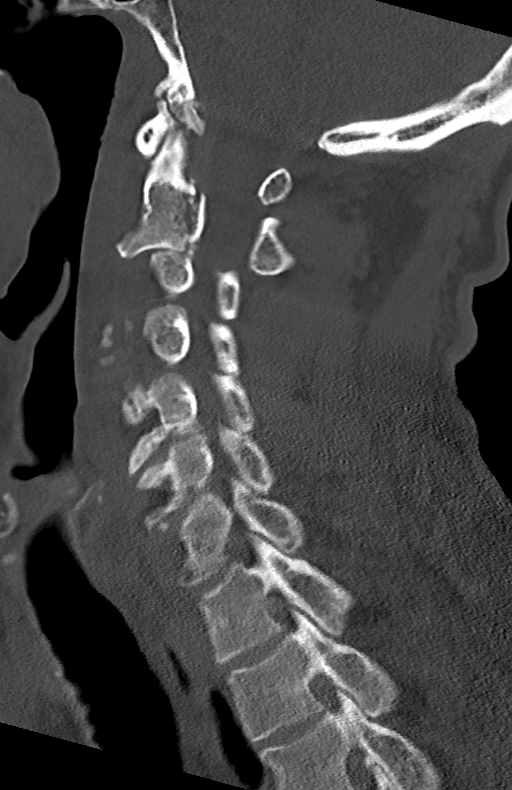
[im 49/61  bone]
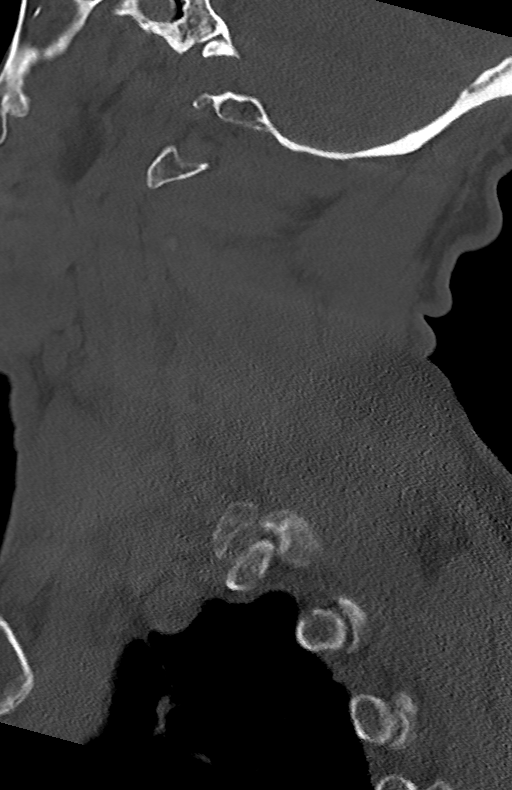

[Series 11: c_spine 2.0 cor bone · coronal · 0.29mm/px · 1 of 61 slices shown]
[im 31/61  bone]
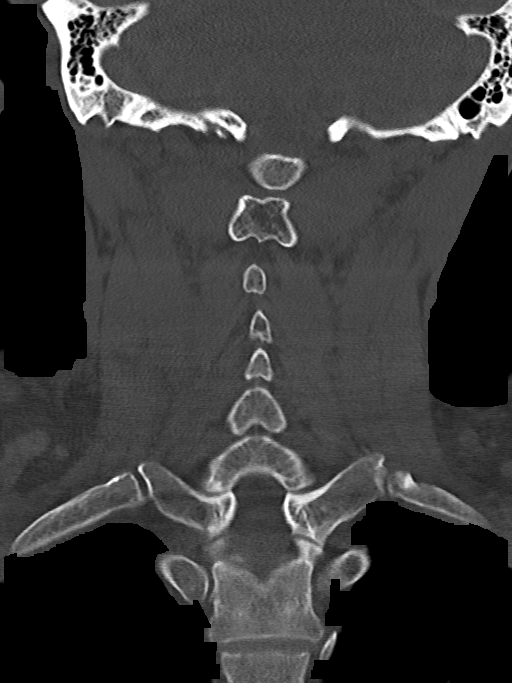

[Series 14: c_spine 2.0 orthogonals · axial · 0.21mm/px · z∈[-262,-196]mm · 2 of 101 slices shown]
[im 34/101  bone]
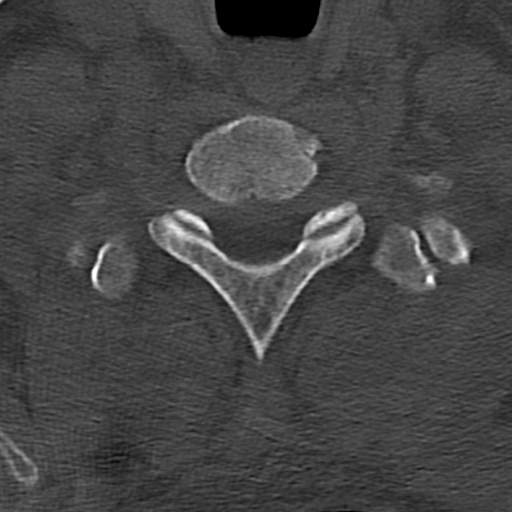
[im 67/101  bone]
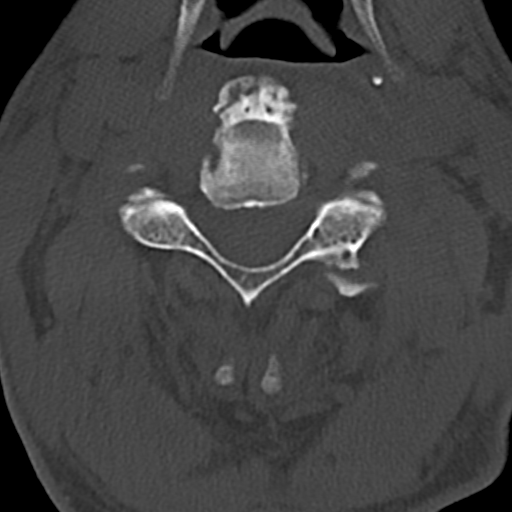

[11 of 33 positions shown; findings below may reference images not displayed]

FINDINGS: CT HEAD FINDINGS

Brain: Large left-sided subdural hematoma over the left convexity
measuring up to 20 mm in thickness within hemorrhage along the falx
and left tentorium cerebelli. Small volume intraventricular
hemorrhage pooling in occipital horns. Bilateral paramedian superior
frontal parenchymal hemorrhage compatible cortical contusion.

Mass effect results in 18 mm left-to-right midline shift,
left-to-right subfalcine herniation, effacement of the quadrigeminal
plate and suprasellar cisterns, effacement of left lateral
ventricle, and right lateral ventricular enlargement likely
representing entrapment.

Vascular: No hyperdense vessel or unexpected calcification.

Skull: Minimally displace right zygomatic maxillary complex fracture
with fractures through the anterior posterior walls of the maxillary
sinus and the right zygomatic arch. Minimally depressed fracture
right nasal bone. Nondisplaced fracture of the right frontal bone
extending the right superior orbital rim, roof of orbit, and
extending to the orbital apex.

Sinuses/Orbits: No displaced orbital fracture or herniation of
orbital contents. No intraorbital hemorrhage. Blood fluid level in
the right maxillary sinus.

Other: None.

CT CERVICAL SPINE FINDINGS

Alignment: As below.

Skull base and vertebrae: Mildly displaced acute fracture of the C2
right lateral mass extending into pedicle and lamina as well as
mildly displaced acute fracture of left lateral mass with transverse
orientation. Posterior displacement of the C2 spinous process due to
displacement of the lateral mass fractures.

Additionally, there is a fracture through the anterior inferior
margin of the dens and left uncinate process of the C3 vertebral
body with 3 mm leftward subluxation of C2 on C3 and grade 1 3 mm
anterolisthesis.

Nondisplaced acute fracture of the left C5 inferior articular facet
(series 8, image 58).

Soft tissues and spinal canal: Epidural hematoma is present at the
C1 into levels anteriorly manifest by increased attenuation of the
epidural space. At C2-3 the epidural hemorrhage results in mild
canal stenosis (series 10, image 34). Additionally, there is
extensive upper cervical prevertebral edema associated with the C2
fractures.

Disc levels: Moderate cervical spondylosis with disc and facet
arthropathy.

Upper chest: Extensive bullous emphysema of lung apices.

Other: Negative.
IMPRESSION: CT head:

1. Large left convexity subdural hematoma, superomedial bifrontal
cortical hemorrhagic contusions, and small volume of
intraventricular hemorrhage.
2. Severe mass effect with 18 mm right-to-left midline shift,
effacement of basilar cisterns, entrapped right lateral ventricle.
3. Nondisplaced right frontal bone fracture extending into roof of
right orbit.
4. Mildly depressed right zygomatic maxillary complex fracture.
CT cervical spine:

1. Comminuted fracture of C2 vertebral body involving both lateral
masses, bilateral pedicles, and the right lamina.
2. Probable C2-3 disc fracture with grade 1 anterolisthesis and
slight leftward C2 on C3 subluxation.
3. Nondisplaced fracture of left C5 inferior articular facet.
4. Epidural hematoma at the C1-2 levels with mild C2-3 canal
stenosis.
These results were called by telephone at the time of interpretation
on 02/23/2017 at [DATE] to Dr. INGUNN HARPA RONLOR , who verbally
acknowledged these results.

By: Artuhin Bobade M.D.
